# Patient Record
Sex: Male | Born: 1975 | Hispanic: Yes | Marital: Married | Smoking: Never smoker
Health system: Southern US, Community
[De-identification: ages and names within clinical notes are randomized; demographics above are authoritative.]

## PROBLEM LIST (undated history)

## (undated) DIAGNOSIS — R7303 Prediabetes: Secondary | ICD-10-CM

---

## 2012-04-24 HISTORY — PX: BACK SURGERY: SHX140

## 2017-12-30 ENCOUNTER — Emergency Department
Admission: EM | Admit: 2017-12-30 | Discharge: 2017-12-30 | Disposition: A | Discharge status: Home / Self Care | Payer: BLUE CROSS/BLUE SHIELD | Attending: Emergency Medicine | Admitting: Emergency Medicine

## 2017-12-30 ENCOUNTER — Encounter: Payer: Self-pay | Admitting: Emergency Medicine

## 2017-12-30 ENCOUNTER — Emergency Department: Payer: BLUE CROSS/BLUE SHIELD

## 2017-12-30 DIAGNOSIS — W1789XA Other fall from one level to another, initial encounter: Secondary | ICD-10-CM | POA: Insufficient documentation

## 2017-12-30 DIAGNOSIS — Y9389 Activity, other specified: Secondary | ICD-10-CM | POA: Insufficient documentation

## 2017-12-30 DIAGNOSIS — S20211A Contusion of right front wall of thorax, initial encounter: Secondary | ICD-10-CM

## 2017-12-30 DIAGNOSIS — Z79899 Other long term (current) drug therapy: Secondary | ICD-10-CM | POA: Insufficient documentation

## 2017-12-30 DIAGNOSIS — S20221A Contusion of right back wall of thorax, initial encounter: Secondary | ICD-10-CM

## 2017-12-30 DIAGNOSIS — Y92008 Other place in unspecified non-institutional (private) residence as the place of occurrence of the external cause: Secondary | ICD-10-CM | POA: Insufficient documentation

## 2017-12-30 DIAGNOSIS — Y998 Other external cause status: Secondary | ICD-10-CM | POA: Diagnosis not present

## 2017-12-30 DIAGNOSIS — S300XXA Contusion of lower back and pelvis, initial encounter: Secondary | ICD-10-CM | POA: Diagnosis not present

## 2017-12-30 DIAGNOSIS — S3992XA Unspecified injury of lower back, initial encounter: Secondary | ICD-10-CM | POA: Diagnosis present

## 2017-12-30 MED ORDER — KETOROLAC TROMETHAMINE 30 MG/ML IJ SOLN
30.0000 mg | Freq: Once | INTRAMUSCULAR | Status: AC
  Administered 2017-12-30: 30 mg via INTRAMUSCULAR
  Filled 2017-12-30: qty 1

## 2017-12-30 MED ORDER — BACLOFEN 10 MG PO TABS: 10.0000 mg | ORAL_TABLET | Freq: Three times a day (TID) | ORAL | 1 refills | Status: AC

## 2017-12-30 MED ORDER — CYCLOBENZAPRINE HCL 10 MG PO TABS
10.0000 mg | ORAL_TABLET | Freq: Once | ORAL | Status: AC
  Administered 2017-12-30: 10 mg via ORAL
  Filled 2017-12-30: qty 1

## 2017-12-30 MED ORDER — MELOXICAM 15 MG PO TABS: 15.0000 mg | ORAL_TABLET | Freq: Every day | ORAL | 2 refills | Status: AC

## 2017-12-30 MED ORDER — TRAMADOL HCL 50 MG PO TABS: 50.0000 mg | ORAL_TABLET | Freq: Four times a day (QID) | ORAL | 0 refills | Status: DC | PRN

## 2017-12-30 NOTE — Discharge Instructions (Addendum)
Follow-up with your regular doctor if not better in 5 - 7 days you can see Dr. Samuel Germany office.  Please call for an appointment.  Apply ice to all areas that hurt.  Return emergency department if you are worsening.  Take medications as prescribed.

## 2017-12-30 NOTE — ED Notes (Signed)
See triage note  Presents s/p fall  States he fell from porch yesterday  Having pain to right lower back  Ambulates slowly d/t pain

## 2017-12-30 NOTE — ED Provider Notes (Signed)
[facility] Emergency Department Provider Note  ____________________________________________   First MD Initiated Contact with Patient 12/30/17 1248     (approximate)  I have reviewed the triage vital signs and the nursing notes.   HISTORY  Chief Complaint Fall    HPI Isaiah Taylor is a 42 y.o. male presents emergency department complaining of right sided lower back pain and right rib pain.  States he had been drinking too much last night and fell off his porch.  He states he does not remember if he lost consciousness or not because he was too drunk.  He denies any headache today.  He is stating he does not have any numbness or tingling.  He says having difficulty with movement.    History reviewed. No pertinent past medical history.  There are no active problems to display for this patient.   History reviewed. No pertinent surgical history.  Prior to Admission medications   Medication Sig Start Date End Date Taking? Authorizing Provider  baclofen (LIORESAL) 10 MG tablet Take 1 tablet (10 mg total) by mouth 3 (three) times daily. 12/30/17 12/30/18  Merelin Human, Roselyn Bering, PA-C  meloxicam (MOBIC) 15 MG tablet Take 1 tablet (15 mg total) by mouth daily. 12/30/17 12/30/18  Traveion Ruddock, Roselyn Bering, PA-C  traMADol (ULTRAM) 50 MG tablet Take 1 tablet (50 mg total) by mouth every 6 (six) hours as needed. 12/30/17   Faythe Ghee, PA-C    Allergies Patient has no known allergies.  No family history on file.  Social History Social History   Tobacco Use  . Smoking status: Never Smoker  . Smokeless tobacco: Never Used  Substance Use Topics  . Alcohol use: Yes    Comment: weekends  . Drug use: Not on file    Review of Systems  Constitutional: No fever/chills Eyes: No visual changes. ENT: No sore throat. Respiratory: Denies cough Genitourinary: Negative for dysuria. Musculoskeletal: Positive for rib and back pain Skin: Negative for  rash.    ____________________________________________   PHYSICAL EXAM:  VITAL SIGNS: ED Triage Vitals  Enc Vitals Group     BP 12/30/17 1217 (!) 182/87     Pulse Rate 12/30/17 1217 92     Resp 12/30/17 1217 18     Temp 12/30/17 1217 98 F (36.7 C)     Temp Source 12/30/17 1217 Oral     SpO2 12/30/17 1217 97 %     Weight 12/30/17 1218 230 lb (104.3 kg)     Height 12/30/17 1218 5\' 6"  (1.676 m)     Head Circumference --      Peak Flow --      Pain Score 12/30/17 1218 10     Pain Loc --      Pain Edu? --      Excl. in GC? --     Constitutional: Alert and oriented. Well appearing and in no acute distress.  Is able to answer all questions appropriately Eyes: Conjunctivae are normal.  Head: Atraumatic. Nose: No congestion/rhinnorhea. Mouth/Throat: Mucous membranes are moist.   Neck:  supple no lymphadenopathy noted Cardiovascular: Normal rate, regular rhythm. Heart sounds are normal Respiratory: Normal respiratory effort.  No retractions, lungs c t a, right ribs are tender to palpation Abd: soft nontender bs normal all 4 quad GU: deferred Musculoskeletal: Lumbar spine is mildly tender there is some tenderness over to the right near the SI joint.  The hip is not tender.  The upper spine is not tender.  The  right rib area is tender.   Neurologic:  Normal speech and language.  Cranial nerves II through XII are grossly intact Skin:  Skin is warm, dry and intact. No rash noted.  No bruising is noted Psychiatric: Mood and affect are normal. Speech and behavior are normal.  ____________________________________________   LABS (all labs ordered are listed, but only abnormal results are displayed)  Labs Reviewed - No data to display ____________________________________________   ____________________________________________  RADIOLOGY  X-ray of the lumbar spine is negative for fracture. Chest x-ray is negative for fractures or any other acute  abnormality  ____________________________________________   PROCEDURES  Procedure(s) performed: Toradol 30 mg IM, Flexeril 10 mg p.o.  Procedures    ____________________________________________   INITIAL IMPRESSION / ASSESSMENT AND PLAN / ED COURSE  Pertinent labs & imaging results that were available during my care of the patient were reviewed by me and considered in my medical decision making (see chart for details).   Patient is a 42 year old male presents emergency department complaining of lower back pain and right rib pain.  He states he was drinking heavily last night and fell off of his porch.  He states he may have hit his head but he is not sure.  He states he did not pass out.  He has had no neurologic problems today.  He is just complaining of the back pain.  He denies abdominal pain.  On physical exam patient appears well.  The lumbar spine is tender to palpation in the right ribs are tender to palpation abdomen is soft and nontender.  Cranial nerves II through XII are grossly intact.  X-ray of the lumbar spine shows no acute abnormality.  The x-ray of the chest is negative for any acute abnormality.  Explained the findings to the patient.  He was given a injection of Toradol 30 mg IM and Flexeril 10 mg p.o. He was given a prescription for baclofen, meloxicam, and tramadol.  The patient was instructed not to drink with these medications.  He is to apply ice to all areas that hurt.  Explained to him if he is worsening or develops abdominal pain he needs to return to the emergency department.  He states he understands and will comply.  He was discharged in stable condition in the care of his family.    As part of my medical decision making, I reviewed the following data within the electronic MEDICAL RECORD NUMBER Nursing notes reviewed and incorporated, Old chart reviewed, Radiograph reviewed x-ray lumbar spine and chest are negative, Notes from prior ED visits and [place] Controlled  Substance Database  ____________________________________________   FINAL CLINICAL IMPRESSION(S) / ED DIAGNOSES  Final diagnoses:  Back contusion, right, initial encounter  Rib contusion, right, initial encounter      NEW MEDICATIONS STARTED DURING THIS VISIT:  Discharge Medication List as of 12/30/2017  2:20 PM    START taking these medications   Details  baclofen (LIORESAL) 10 MG tablet Take 1 tablet (10 mg total) by mouth 3 (three) times daily., Starting Sun 12/30/2017, Until Mon 12/30/2018, Normal    meloxicam (MOBIC) 15 MG tablet Take 1 tablet (15 mg total) by mouth daily., Starting Sun 12/30/2017, Until Mon 12/30/2018, Normal    traMADol (ULTRAM) 50 MG tablet Take 1 tablet (50 mg total) by mouth every 6 (six) hours as needed., Starting Sun 12/30/2017, Normal         Note:  This document was prepared using Dragon voice recognition software and may include unintentional  dictation errors.    Faythe Ghee, PA-C 12/30/17 1513    Dionne Bucy, MD 12/30/17 [phone]

## 2017-12-30 NOTE — ED Triage Notes (Signed)
Patient presents to the ED right side pain post fall yesterday evening.  Patient states he had been drinking too much prior to fall.  Patient states he hit his head but did not pass out.  Patient ambulatory to triage.  No obvious distress at this time.

## 2019-10-06 ENCOUNTER — Other Ambulatory Visit: Payer: Self-pay | Admitting: Radiology

## 2019-10-06 ENCOUNTER — Ambulatory Visit
Admission: RE | Admit: 2019-10-06 | Discharge: 2019-10-06 | Disposition: A | Discharge status: Home / Self Care | Payer: PRIVATE HEALTH INSURANCE | Source: Ambulatory Visit | Attending: Student | Admitting: Student

## 2019-10-06 ENCOUNTER — Other Ambulatory Visit: Payer: Self-pay

## 2019-10-06 ENCOUNTER — Other Ambulatory Visit (HOSPITAL_COMMUNITY): Payer: Self-pay | Admitting: Radiology

## 2019-10-06 ENCOUNTER — Other Ambulatory Visit (HOSPITAL_COMMUNITY): Payer: Self-pay | Admitting: Student

## 2019-10-06 DIAGNOSIS — M7989 Other specified soft tissue disorders: Secondary | ICD-10-CM

## 2020-07-22 ENCOUNTER — Other Ambulatory Visit (HOSPITAL_COMMUNITY): Payer: Self-pay | Admitting: Sports Medicine

## 2020-07-22 ENCOUNTER — Other Ambulatory Visit: Payer: Self-pay | Admitting: Sports Medicine

## 2020-07-22 DIAGNOSIS — M19011 Primary osteoarthritis, right shoulder: Secondary | ICD-10-CM

## 2020-07-22 DIAGNOSIS — M7541 Impingement syndrome of right shoulder: Secondary | ICD-10-CM

## 2020-07-22 DIAGNOSIS — M778 Other enthesopathies, not elsewhere classified: Secondary | ICD-10-CM

## 2020-07-22 DIAGNOSIS — M25511 Pain in right shoulder: Secondary | ICD-10-CM

## 2020-07-26 ENCOUNTER — Ambulatory Visit
Admission: RE | Admit: 2020-07-26 | Discharge: 2020-07-26 | Disposition: A | Discharge status: Home / Self Care | Payer: BC Managed Care – PPO | Source: Ambulatory Visit | Attending: Sports Medicine | Admitting: Sports Medicine

## 2020-07-26 ENCOUNTER — Other Ambulatory Visit: Payer: Self-pay

## 2020-07-26 DIAGNOSIS — M19011 Primary osteoarthritis, right shoulder: Secondary | ICD-10-CM | POA: Diagnosis present

## 2020-07-26 DIAGNOSIS — M25511 Pain in right shoulder: Secondary | ICD-10-CM | POA: Diagnosis present

## 2020-07-26 DIAGNOSIS — M778 Other enthesopathies, not elsewhere classified: Secondary | ICD-10-CM | POA: Insufficient documentation

## 2020-07-26 DIAGNOSIS — M7541 Impingement syndrome of right shoulder: Secondary | ICD-10-CM | POA: Insufficient documentation

## 2020-08-09 ENCOUNTER — Other Ambulatory Visit: Payer: Self-pay | Admitting: Orthopedic Surgery

## 2020-08-16 ENCOUNTER — Other Ambulatory Visit: Payer: Self-pay

## 2020-08-16 ENCOUNTER — Other Ambulatory Visit
Admission: RE | Admit: 2020-08-16 | Discharge: 2020-08-16 | Disposition: A | Discharge status: Home / Self Care | Payer: BC Managed Care – PPO | Source: Ambulatory Visit | Attending: Orthopedic Surgery | Admitting: Orthopedic Surgery

## 2020-08-16 HISTORY — DX: Prediabetes: R73.03

## 2020-08-16 NOTE — Patient Instructions (Addendum)
Your procedure is scheduled on: Monday Aug 23, 2020. Su procedimiento est programado para: Lunes 2 de Mayo del 2022. Report to Day Surgery inside [facility] 2nd floor (stop by admissions desk first before getting on elevator). Presntese a: [facility] dentro del [facility] 2do piso (registrese primero en admisiones antes de subir al elevador) To find out your arrival time please call [phone] between 1PM - 3PM on Friday August 20, 2020. Para saber su hora de llegada por favor llame al [phone] Eusebio Me la 1PM - 3PM el da: Viernes 29 de Abril del 2022.   Remember: Instructions that are not followed completely may result in serious medical risk, up to and including death,  or upon the discretion of your surgeon and anesthesiologist your surgery may need to be rescheduled.  Recuerde: Las instrucciones que no se siguen completamente Armed forces logistics/support/administrative officer en un riesgo de salud grave, incluyendo hasta  la [place] o a discrecin de su cirujano y Scientific laboratory technician, su ciruga se puede posponer.   __X_ 1.Do not eat food after midnight the night before your procedure. No    gum chewing or hard candies. You may drink clear liquids up to 2 hours     before you are scheduled to arrive for your surgery- DO not drink clear     Liquids within 2 hours of the start of your surgery.     Clear Liquids include:    water, apple juice without pulp, clear carbohydrate drink such as    Clearfast of [place], Black Coffee or Tea (Do not add anything to coffee or tea).      No coma nada despus de la medianoche de la noche anterior a su    procedimiento. No coma chicles ni caramelos duros. Puede tomar    lquidos claros hasta 2 horas antes de su hora programada de llegada al     hospital para su procedimiento. No tome lquidos claros durante el     transcurso de las 2 horas de su llegada programada al hospital para su     procedimiento, ya que esto puede llevar a que su procedimiento se    retrase o tenga  que volver a Magazine features editor.  Los lquidos claros incluyen:          - Agua o jugo de [place] sin pulpa          - Bebidas claras con carbohidratos como ClearFast o Gatorade          - Caf negro o t claro (sin leche, sin cremas, no agregue nada al caf ni al t)  No tome nada que no est en esta lista.  Los pacientes con diabetes tipo 1 y tipo 2 solo deben Printmaker.  Llame a la clnica de [facility] o a la unidad de Same Day Surgery si  tiene alguna pregunta sobre estas instrucciones.              _X__ 2.Do Not Smoke or use e-cigarettes For 24 Hours Prior to Your Surgery.    Do not use any chewable tobacco products for at least 6   hours prior to surgery.    No fume ni use cigarrillos electrnicos durante las 24 horas previas    a su [place].  No use ningn producto de tabaco masticable durante   al menos 6 horas antes de la [place].     __X_ 3. No alcohol for 24 hours before or after surgery.    No tome alcohol durante las 24  horas antes ni despus de la [place].   __X__4. On the morning of surgery brush your teeth with toothpaste and water, you                may rinse your mouth with mouthwash if you wish.  Do not swallow any toothpaste of mouthwash.   En la maana de la [place], cepllese los dientes con pasta de dientes y Summer Set,                [place] enjuagarse la boca con enjuague bucal si lo desea. No ingiera ninguna pasta de dientes o enjuague bucal.   __X__ 5. Notify your doctor if there is any change in your medical condition (cold,fever, infections).    Informe a su mdico si hay algn cambio en su condicin mdica  (resfriado, fiebre, infecciones).   Do not wear jewelry, make-up, hairpins, clips or nail polish.  No use joyas, maquillajes, pinzas/ganchos para el cabello ni esmalte de uas.  Do not wear lotions, powders, or perfumes. You may wear deodorant.  No use lociones, polvos o perfumes.    Do not shave 48 hours prior to surgery. Men may shave face and neck.  No se  afeite 48 horas antes de la [place].  Los hombres pueden [employer] cara  y el cuello.   Do not bring valuables to the hospital.   No lleve objetos de valor al hospital.  [facility] is not responsible for any belongings or valuables.  [place] no se hace responsable de ningn tipo de pertenencias u objetos de Licensed conveyancer.               Contacts, dentures or bridgework may not be worn into surgery.  Los lentes de [place], las dentaduras postizas o puentes no se pueden usar en la [place].   Leave your suitcase in the car. After surgery it may be brought to your room.  Deje su maleta en el auto.  Despus de la ciruga podr traerla a su habitacin.   For patients admitted to the hospital, discharge time is determined by your  treatment team.  Para los pacientes que sean ingresados al hospital, el tiempo en el cual se le  dar de alta es determinado por su equipo de [place].   Patients discharged the day of surgery will not be allowed to drive home. A los pacientes que se les da de alta el mismo da de la ciruga no se les permitir conducir a Higher education careers adviser.   __X__ Take these medicines the morning of surgery with A SIP OF WATER:          [place] medicinas la maana de la ciruga con UN SORBO DE AGUA:    1. traMADol (ULTRAM) 50 MG (if needed)  2. oxyCODONE-acetaminophen (PERCOCET/ROXICET) 5-325 MG (if needed)  3.       ____ Fleet Enema (as directed)          Enema de Fleet (segn lo indicado)    __X__ Use CHG Soap as directed          Utilice el jabn de CHG segn lo indicado  ____ Use inhalers on the day of surgery          Use los inhaladores el da de la ciruga  ____ Stop metformin 2 days prior to surgery          Deje de tomar el metformin 2 das antes de la cir[place]    ____ Take 1/2 of usual insulin  dose the night before surgery and none on the morning of surgery           Tome la mitad de la dosis habitual de insulina la noche antes de la [place] y no tome nada en la maana  de la             ciruga  ____ Stop Coumadin/Plavix/aspirin           Deje de tomar el Coumadin/Plavix/aspirina el da:  __X__ Stop Anti-inflammatories such as meloxicam (MOBIC), Ibuprofen, Aleve, Advil, naproxen, aspirin, Goody's or BC powders.           Deje de tomar antiinflamatorios como meloxicam (MOBIC), Ibuprofen, Aleve, Advil, naproxen, aspirinas, Goody's or BC powders.    __X__ Stop supplements until after surgery            Deje de tomar suplementos hasta despus de la ciruga  ____ Bring C-Pap to the hospital          Lleve el C-Pap al hospital

## 2020-08-19 ENCOUNTER — Other Ambulatory Visit: Payer: Self-pay

## 2020-08-19 ENCOUNTER — Other Ambulatory Visit
Admission: RE | Admit: 2020-08-19 | Discharge: 2020-08-19 | Disposition: A | Discharge status: Home / Self Care | Payer: BC Managed Care – PPO | Source: Ambulatory Visit | Attending: Orthopedic Surgery | Admitting: Orthopedic Surgery

## 2020-08-19 DIAGNOSIS — Z01812 Encounter for preprocedural laboratory examination: Secondary | ICD-10-CM | POA: Diagnosis not present

## 2020-08-19 DIAGNOSIS — Z20822 Contact with and (suspected) exposure to covid-19: Secondary | ICD-10-CM | POA: Insufficient documentation

## 2020-08-19 LAB — SARS CORONAVIRUS 2 (TAT 6-24 HRS): SARS Coronavirus 2: NEGATIVE

## 2020-08-23 ENCOUNTER — Ambulatory Visit: Payer: BC Managed Care – PPO | Admitting: Anesthesiology

## 2020-08-23 ENCOUNTER — Encounter
Admission: RE | Disposition: A | Discharge status: Home / Self Care | Payer: Self-pay | Source: Ambulatory Visit | Attending: Orthopedic Surgery

## 2020-08-23 ENCOUNTER — Ambulatory Visit
Admission: RE | Admit: 2020-08-23 | Discharge: 2020-08-23 | Disposition: A | Discharge status: Home / Self Care | Payer: BC Managed Care – PPO | Source: Ambulatory Visit | Attending: Orthopedic Surgery | Admitting: Orthopedic Surgery

## 2020-08-23 ENCOUNTER — Ambulatory Visit: Payer: BC Managed Care – PPO

## 2020-08-23 DIAGNOSIS — Z79899 Other long term (current) drug therapy: Secondary | ICD-10-CM | POA: Insufficient documentation

## 2020-08-23 DIAGNOSIS — E119 Type 2 diabetes mellitus without complications: Secondary | ICD-10-CM | POA: Insufficient documentation

## 2020-08-23 DIAGNOSIS — Z419 Encounter for procedure for purposes other than remedying health state, unspecified: Secondary | ICD-10-CM

## 2020-08-23 DIAGNOSIS — Z833 Family history of diabetes mellitus: Secondary | ICD-10-CM | POA: Insufficient documentation

## 2020-08-23 DIAGNOSIS — M19011 Primary osteoarthritis, right shoulder: Secondary | ICD-10-CM | POA: Insufficient documentation

## 2020-08-23 DIAGNOSIS — M75101 Unspecified rotator cuff tear or rupture of right shoulder, not specified as traumatic: Secondary | ICD-10-CM | POA: Diagnosis present

## 2020-08-23 SURGERY — SHOULDER ARTHROSCOPY WITH SUBACROMIAL DECOMPRESSION AND DISTAL CLAVICLE EXCISION
Anesthesia: General | Laterality: Right

## 2020-08-23 MED ORDER — ONDANSETRON HCL 4 MG/2ML IJ SOLN
INTRAMUSCULAR | Status: AC
  Administered 2020-08-23: 4 mg via INTRAVENOUS
  Filled 2020-08-23: qty 2

## 2020-08-23 MED ORDER — FENTANYL CITRATE (PF) 100 MCG/2ML IJ SOLN
INTRAMUSCULAR | Status: DC | PRN
  Administered 2020-08-23: 25 ug via INTRAVENOUS

## 2020-08-23 MED ORDER — BUPIVACAINE HCL (PF) 0.5 % IJ SOLN
INTRAMUSCULAR | Status: AC
  Filled 2020-08-23: qty 10

## 2020-08-23 MED ORDER — ACETAMINOPHEN 10 MG/ML IV SOLN
INTRAVENOUS | Status: AC
  Filled 2020-08-23: qty 100

## 2020-08-23 MED ORDER — ORAL CARE MOUTH RINSE: 15.0000 mL | Freq: Once | OROMUCOSAL | Status: AC

## 2020-08-23 MED ORDER — HYDROMORPHONE HCL 1 MG/ML IJ SOLN
INTRAMUSCULAR | Status: AC
  Filled 2020-08-23: qty 1

## 2020-08-23 MED ORDER — LIDOCAINE HCL (CARDIAC) PF 100 MG/5ML IV SOSY
PREFILLED_SYRINGE | INTRAVENOUS | Status: DC | PRN
  Administered 2020-08-23: 60 mg via INTRAVENOUS

## 2020-08-23 MED ORDER — CEFAZOLIN SODIUM-DEXTROSE 2-4 GM/100ML-% IV SOLN
INTRAVENOUS | Status: AC
  Filled 2020-08-23: qty 100

## 2020-08-23 MED ORDER — DEXAMETHASONE SODIUM PHOSPHATE 10 MG/ML IJ SOLN
INTRAMUSCULAR | Status: DC | PRN
  Administered 2020-08-23: 10 mg via INTRAVENOUS

## 2020-08-23 MED ORDER — FAMOTIDINE 20 MG PO TABS
ORAL_TABLET | ORAL | Status: AC
  Administered 2020-08-23: 20 mg via ORAL
  Filled 2020-08-23: qty 1

## 2020-08-23 MED ORDER — MIDAZOLAM HCL 2 MG/2ML IJ SOLN
INTRAMUSCULAR | Status: AC
  Administered 2020-08-23: 1 mg via INTRAVENOUS
  Filled 2020-08-23: qty 2

## 2020-08-23 MED ORDER — BUPIVACAINE LIPOSOME 1.3 % IJ SUSP
INTRAMUSCULAR | Status: AC
  Filled 2020-08-23: qty 20

## 2020-08-23 MED ORDER — ONDANSETRON 4 MG PO TBDP: 4.0000 mg | ORAL_TABLET | Freq: Three times a day (TID) | ORAL | 0 refills | Status: DC | PRN

## 2020-08-23 MED ORDER — MIDAZOLAM HCL 2 MG/2ML IJ SOLN: 1.0000 mg | Freq: Once | INTRAMUSCULAR | Status: AC

## 2020-08-23 MED ORDER — FENTANYL CITRATE (PF) 100 MCG/2ML IJ SOLN: 25.0000 ug | INTRAMUSCULAR | Status: DC | PRN

## 2020-08-23 MED ORDER — ONDANSETRON HCL 4 MG/2ML IJ SOLN
INTRAMUSCULAR | Status: DC | PRN
  Administered 2020-08-23: 4 mg via INTRAVENOUS

## 2020-08-23 MED ORDER — SUCCINYLCHOLINE CHLORIDE 20 MG/ML IJ SOLN
INTRAMUSCULAR | Status: DC | PRN
Start: 2020-08-23 — End: 2020-08-24
  Administered 2020-08-23: 100 mg via INTRAVENOUS

## 2020-08-23 MED ORDER — 0.9 % SODIUM CHLORIDE (POUR BTL) OPTIME
TOPICAL | Status: DC | PRN
  Administered 2020-08-23: 1000 mL

## 2020-08-23 MED ORDER — PROPOFOL 10 MG/ML IV BOLUS
INTRAVENOUS | Status: AC
  Filled 2020-08-23: qty 20

## 2020-08-23 MED ORDER — FENTANYL CITRATE (PF) 100 MCG/2ML IJ SOLN: 50.0000 ug | Freq: Once | INTRAMUSCULAR | Status: AC

## 2020-08-23 MED ORDER — LIDOCAINE HCL (PF) 2 % IJ SOLN
INTRAMUSCULAR | Status: AC
  Filled 2020-08-23: qty 5

## 2020-08-23 MED ORDER — LACTATED RINGERS IV SOLN: INTRAVENOUS | Status: DC

## 2020-08-23 MED ORDER — ONDANSETRON HCL 4 MG/2ML IJ SOLN: 4.0000 mg | Freq: Once | INTRAMUSCULAR | Status: AC | PRN

## 2020-08-23 MED ORDER — OXYCODONE HCL 5 MG PO TABS: 5.0000 mg | ORAL_TABLET | Freq: Once | ORAL | Status: DC | PRN

## 2020-08-23 MED ORDER — FENTANYL CITRATE (PF) 100 MCG/2ML IJ SOLN
INTRAMUSCULAR | Status: AC
  Filled 2020-08-23: qty 2

## 2020-08-23 MED ORDER — OXYCODONE HCL 5 MG/5ML PO SOLN
5.0000 mg | Freq: Once | ORAL | Status: DC | PRN
Start: 2020-08-23 — End: 2020-08-23

## 2020-08-23 MED ORDER — BUPIVACAINE LIPOSOME 1.3 % IJ SUSP
INTRAMUSCULAR | Status: DC | PRN
  Administered 2020-08-23: 10 mL

## 2020-08-23 MED ORDER — MIDAZOLAM HCL 2 MG/2ML IJ SOLN
INTRAMUSCULAR | Status: AC
  Filled 2020-08-23: qty 2

## 2020-08-23 MED ORDER — CHLORHEXIDINE GLUCONATE 0.12 % MT SOLN: 15.0000 mL | Freq: Once | OROMUCOSAL | Status: AC

## 2020-08-23 MED ORDER — FENTANYL CITRATE (PF) 100 MCG/2ML IJ SOLN
INTRAMUSCULAR | Status: AC
  Administered 2020-08-23: 50 ug via INTRAVENOUS
  Filled 2020-08-23: qty 2

## 2020-08-23 MED ORDER — ACETAMINOPHEN 500 MG PO TABS: 1000.0000 mg | ORAL_TABLET | Freq: Three times a day (TID) | ORAL | 2 refills | Status: AC

## 2020-08-23 MED ORDER — SUGAMMADEX SODIUM 200 MG/2ML IV SOLN
INTRAVENOUS | Status: DC | PRN
  Administered 2020-08-23: 200 mg via INTRAVENOUS

## 2020-08-23 MED ORDER — CHLORHEXIDINE GLUCONATE 0.12 % MT SOLN
OROMUCOSAL | Status: AC
  Administered 2020-08-23: 15 mL via OROMUCOSAL
  Filled 2020-08-23: qty 15

## 2020-08-23 MED ORDER — ROCURONIUM BROMIDE 100 MG/10ML IV SOLN
INTRAVENOUS | Status: DC | PRN
  Administered 2020-08-23 (×2): 10 mg via INTRAVENOUS
  Administered 2020-08-23: 40 mg via INTRAVENOUS
  Administered 2020-08-23 (×2): 10 mg via INTRAVENOUS
  Administered 2020-08-23: 20 mg via INTRAVENOUS

## 2020-08-23 MED ORDER — ASPIRIN EC 325 MG PO TBEC: 325.0000 mg | DELAYED_RELEASE_TABLET | Freq: Every day | ORAL | 0 refills | Status: AC

## 2020-08-23 MED ORDER — OXYCODONE HCL 5 MG PO TABS: 5.0000 mg | ORAL_TABLET | ORAL | 0 refills | Status: AC | PRN

## 2020-08-23 MED ORDER — CEFAZOLIN SODIUM-DEXTROSE 2-4 GM/100ML-% IV SOLN
2.0000 g | INTRAVENOUS | Status: AC
  Administered 2020-08-23: 2 g via INTRAVENOUS

## 2020-08-23 MED ORDER — PROPOFOL 10 MG/ML IV BOLUS
INTRAVENOUS | Status: DC | PRN
  Administered 2020-08-23: 150 mg via INTRAVENOUS
  Administered 2020-08-23: 50 mg via INTRAVENOUS

## 2020-08-23 MED ORDER — FAMOTIDINE 20 MG PO TABS: 20.0000 mg | ORAL_TABLET | Freq: Once | ORAL | Status: AC

## 2020-08-23 MED ORDER — ACETAMINOPHEN 10 MG/ML IV SOLN: 1000.0000 mg | Freq: Once | INTRAVENOUS | Status: DC | PRN

## 2020-08-23 MED ORDER — BUPIVACAINE HCL (PF) 0.5 % IJ SOLN
INTRAMUSCULAR | Status: DC | PRN
  Administered 2020-08-23: 10 mL

## 2020-08-23 MED ORDER — LACTATED RINGERS IV SOLN
INTRAVENOUS | Status: DC | PRN
  Administered 2020-08-23: 4 mL

## 2020-08-23 MED ORDER — ACETAMINOPHEN 10 MG/ML IV SOLN
INTRAVENOUS | Status: DC | PRN
  Administered 2020-08-23: 1000 mg via INTRAVENOUS

## 2020-08-23 SURGICAL SUPPLY — 83 items
ADAPTER IRRIG TUBE 2 SPIKE SOL (ADAPTER) ×4 IMPLANT
ADH SKN CLS APL DERMABOND .7 (GAUZE/BANDAGES/DRESSINGS) ×1
ADPR TBG 2 SPK PMP STRL ASCP (ADAPTER) ×2
ANCH SUT 2 SWLK 19.1 CLS EYLT (Anchor) ×2 IMPLANT
ANCH SUT SWLK 19.1X4.75 (Anchor) ×3 IMPLANT
ANCHOR 3.9 PEEK 3 CORKSCREW (Anchor) ×2 IMPLANT
ANCHOR SUT BIO SW 4.75X19.1 (Anchor) ×6 IMPLANT
ANCHOR SWIVELOCK BIO 4.75X19.1 (Anchor) ×4 IMPLANT
APL PRP STRL LF DISP 70% ISPRP (MISCELLANEOUS) ×1
BUR BR 5.5 12 FLUTE (BURR) ×2 IMPLANT
BUR RADIUS 4.0X18.5 (BURR) ×2 IMPLANT
BUR RADIUS 5.5 (BURR) ×2 IMPLANT
CANNULA PART THRD DISP 5.75X7 (CANNULA) ×4 IMPLANT
CANNULA PARTIAL THREAD 2X7 (CANNULA) ×2 IMPLANT
CHLORAPREP W/TINT 26 (MISCELLANEOUS) ×2 IMPLANT
COOLER POLAR GLACIER W/PUMP (MISCELLANEOUS) ×2 IMPLANT
COVER WAND RF STERILE (DRAPES) ×2 IMPLANT
DERMABOND ADVANCED (GAUZE/BANDAGES/DRESSINGS) ×1
DERMABOND ADVANCED .7 DNX12 (GAUZE/BANDAGES/DRESSINGS) ×1 IMPLANT
DEVICE SUCT BLK HOLE OR FLOOR (MISCELLANEOUS) ×4 IMPLANT
DRAPE 3/4 80X56 (DRAPES) ×2 IMPLANT
DRAPE IMP U-DRAPE 54X76 (DRAPES) ×4 IMPLANT
DRAPE INCISE IOBAN 66X45 STRL (DRAPES) ×2 IMPLANT
DRAPE ORTHO SPLIT 77X108 STRL (DRAPES) ×4
DRAPE STERI 35X30 U-POUCH (DRAPES) ×2 IMPLANT
DRAPE SURG ORHT 6 SPLT 77X108 (DRAPES) ×2 IMPLANT
DRAPE U-SHAPE 47X51 STRL (DRAPES) ×4 IMPLANT
DRSG TEGADERM 4X4.75 (GAUZE/BANDAGES/DRESSINGS) ×6 IMPLANT
ELECT REM PT RETURN 9FT ADLT (ELECTROSURGICAL)
ELECTRODE REM PT RTRN 9FT ADLT (ELECTROSURGICAL) IMPLANT
GAUZE SPONGE 4X4 12PLY STRL (GAUZE/BANDAGES/DRESSINGS) ×2 IMPLANT
GAUZE XEROFORM 1X8 LF (GAUZE/BANDAGES/DRESSINGS) ×2 IMPLANT
GLOVE SRG 8 PF TXTR STRL LF DI (GLOVE) ×2 IMPLANT
GLOVE SURG ENC MOIS LTX SZ7.5 (GLOVE) ×2 IMPLANT
GLOVE SURG ORTHO LTX SZ8 (GLOVE) ×2 IMPLANT
GLOVE SURG SYN 7.5  E (GLOVE) ×1
GLOVE SURG SYN 7.5 E (GLOVE) ×1 IMPLANT
GLOVE SURG UNDER POLY LF SZ8 (GLOVE) ×4
GOWN STRL REUS W/ TWL LRG LVL3 (GOWN DISPOSABLE) ×2 IMPLANT
GOWN STRL REUS W/TWL LRG LVL3 (GOWN DISPOSABLE) ×4
GOWN STRL REUS W/TWL LRG LVL4 (GOWN DISPOSABLE) ×2 IMPLANT
IV LACTATED RINGER IRRG 3000ML (IV SOLUTION) ×16
IV LR IRRIG 3000ML ARTHROMATIC (IV SOLUTION) ×8 IMPLANT
KIT CORKSCREW KNTLS 3.9 S/T/P (INSTRUMENTS) ×2 IMPLANT
KIT STABILIZATION SHOULDER (MISCELLANEOUS) ×2 IMPLANT
KIT SUTURETAK 3.0 INSERT PERC (KITS) ×2 IMPLANT
KIT TURNOVER KIT A (KITS) ×2 IMPLANT
MANIFOLD NEPTUNE II (INSTRUMENTS) ×4 IMPLANT
MASK FACE SPIDER DISP (MASK) ×2 IMPLANT
MAT ABSORB  FLUID 56X50 GRAY (MISCELLANEOUS) ×2
MAT ABSORB FLUID 56X50 GRAY (MISCELLANEOUS) ×2 IMPLANT
NDL SAFETY ECLIPSE 18X1.5 (NEEDLE) ×1 IMPLANT
NEEDLE HYPO 18GX1.5 SHARP (NEEDLE) ×2
NEEDLE HYPO 22GX1.5 SAFETY (NEEDLE) ×2 IMPLANT
NS IRRIG 500ML POUR BTL (IV SOLUTION) ×2 IMPLANT
PACK ARTHROSCOPY SHOULDER (MISCELLANEOUS) ×2 IMPLANT
PAD ABD DERMACEA PRESS 5X9 (GAUZE/BANDAGES/DRESSINGS) ×2 IMPLANT
PAD ARMBOARD 7.5X6 YLW CONV (MISCELLANEOUS) ×2 IMPLANT
PAD WRAPON POLAR SHDR XLG (MISCELLANEOUS) ×1 IMPLANT
PASSER SUT FIRSTPASS SELF (INSTRUMENTS) ×4 IMPLANT
PENCIL SMOKE EVACUATOR (MISCELLANEOUS) ×2 IMPLANT
SET TUBE SUCT SHAVER OUTFL 24K (TUBING) ×2 IMPLANT
SET TUBE TIP INTRA-ARTICULAR (MISCELLANEOUS) ×2 IMPLANT
SLING ULTRA II M (MISCELLANEOUS) ×2 IMPLANT
STAPLER SKIN PROX 35W (STAPLE) ×2 IMPLANT
STRAP SAFETY 5IN WIDE (MISCELLANEOUS) ×2 IMPLANT
SUT ETHILON 3-0 FS-10 30 BLK (SUTURE) ×2
SUT LASSO 90 DEG CVD (SUTURE) ×2 IMPLANT
SUT LASSO 90 DEG SD STR (SUTURE) ×2 IMPLANT
SUT MNCRL 4-0 (SUTURE) ×2
SUT MNCRL 4-0 27XMFL (SUTURE) ×1
SUT PDS AB 0 CT1 27 (SUTURE) ×2 IMPLANT
SUT VIC AB 0 CT1 36 (SUTURE) ×2 IMPLANT
SUT VIC AB 2-0 CT2 27 (SUTURE) ×2 IMPLANT
SUTURE EHLN 3-0 FS-10 30 BLK (SUTURE) ×1 IMPLANT
SUTURE MNCRL 4-0 27XMF (SUTURE) ×1 IMPLANT
SYR 10ML LL (SYRINGE) ×2 IMPLANT
SYSTEM FBRTK BICEPS 1.9 DRILL (Anchor) ×2 IMPLANT
TAPE CLOTH 3X10 WHT NS LF (GAUZE/BANDAGES/DRESSINGS) ×2 IMPLANT
TUBING ARTHRO INFLOW-ONLY STRL (TUBING) ×2 IMPLANT
TUBING CONNECTING 10 (TUBING) ×2 IMPLANT
WRAP SHOULDER HOT/COLD PACK (SOFTGOODS) ×2 IMPLANT
WRAPON POLAR PAD SHDR XLG (MISCELLANEOUS) ×2

## 2020-08-23 NOTE — H&P (Signed)
Paper H&P to be scanned into permanent record. H&P reviewed. No significant changes noted.  

## 2020-08-23 NOTE — Op Note (Signed)
SURGERY DATE:  08/23/2020   PRE-OP DIAGNOSIS:  1. Right rotator cuff tear (subscapularis, supraspinatus, infraspinatus) 2. Right subacromial impingement 3. Right biceps tendinopathy 4. Right acromioclavicular joint arthritis   POST-OP DIAGNOSIS: 1. Right rotator cuff tear (subscapularis, supraspinatus, infraspinatus) 2. Right subacromial impingement 3. Right biceps tendinopathy 4. Right acromioclavicular joint arthritis  PROCEDURES:  1. Right arthroscopic rotator cuff repair (subscapularis) 2. Right mini-open rotator cuff repair (supraspinatus and infraspinatus) 3. Right open biceps tenodesis 4. Right arthroscopic extensive debridement of shoulder (glenohumeral and subacromial spaces) 5. Right arthroscopic distal clavicle excision 6. Right arthroscopic subacromial decompression   SURGEON: Rosealee Albee, MD   ASSISTANT: Sonny Dandy, PA   ANESTHESIA: Gen with Exparel interscalene block   ESTIMATED BLOOD LOSS: 25cc   DRAINS:  none   TOTAL IV FLUIDS: per anesthesia      SPECIMENS: none   IMPLANTS: - Arthrex 4.53mm SwiveLock x 5 - Arthrex FiberTak Suture Anchor - Double Loaded - x1 - Arthrex 3.73mm Knotless Corkscrew - x1   OPERATIVE FINDINGS:  Examination under anesthesia: A careful examination under anesthesia was performed.  Passive range of motion was: FF: 150; ER at side: 70; ER in abduction: 100; IR in abduction: 45.  Anterior load shift: NT.  Posterior load shift: NT.  Sulcus in neutral: NT.  Sulcus in ER: NT.     Intra-operative findings: A thorough arthroscopic examination of the shoulder was performed.  The findings are: 1. Biceps tendon: significant tendinopathy, thickening, and longitudinal split tearing 2. Superior labrum: injected with surrounding synovitis 3. Posterior labrum and capsule: normal 4. Inferior capsule and inferior recess: normal 5. Glenoid cartilage surface: Normal 6. Supraspinatus attachment: Full-thickness tear of the musculotendinous  junction 7. Posterior rotator cuff attachment: Full-thickness tear of the anterior infraspinatus at the musculotendinous junction 8. Humeral head articular cartilage: normal 9. Rotator interval: significant synovitis 10: Subscapularis tendon: superior border partial thickness tear 11. Anterior labrum: degenerative 12. IGHL: normal   OPERATIVE REPORT:    Indications for procedure:  Isaiah Taylor is a 45 y.o. male with approximately 5-6 months of shoulder pain. He has had difficulty lifting his arm over his head and difficulty with work, which involves significant overhead and heavy activity. He has undergone a trial of medications, activity modifications, and corticosteroid injections without appropriate relief of symptoms. Of note, he did get significant partial benefit from prior [facility] joint corticosteroid injection. He is unable to work at his normal capacity due to his shoulder pain and weakness. Clinical exam and MRI were suggestive of rotator cuff tear including subscapularis, supraspinatus, and infraspinatus tears; subacromial impingement; AC joint arthritis and biceps tendinopathy. Given these findings, we decided to proceed with surgical management. After discussion of risks, benefits, and alternatives to surgery, the patient elected to proceed.    Procedure in detail:   I identified Isaiah Taylor in the pre-operative holding area.  I marked the operative shoulder with my initials. I reviewed the risks and benefits of the proposed surgical intervention, and the patient (and/or patient's guardian) wished to proceed.  Anesthesia was then performed with an Exparel interscalene block.  The patient was transferred to the operative suite and placed in the beach chair position.     SCDs were placed on the lower extremities. Appropriate IV antibiotics were administered prior to incision. The operative upper extremity was then prepped and draped in standard fashion. A time out was performed  confirming the correct extremity, correct patient, and correct procedure.    I then created a  standard posterior portal with an 11 blade. The glenohumeral joint was easily entered with a blunt trocar and the arthroscope introduced. The findings of diagnostic arthroscopy are described above.  A standard anterior portal was made.  I debrided degenerative tissue including the synovitic tissue about the rotator interval as well as the anterior and superior labrum. I then coagulated the inflamed synovium to obtain hemostasis and reduce the risk of post-operative swelling using an Arthrocare radiofrequency device. The biceps tendon was cut at its insertion on the superior labrum with arthroscopic scissors. The biceps anchor complex was debrided down to its insertion on the superior labrum with an oscillating shaver. An ArthroCare wand was used to debride the proximal biceps tendon stump   Next, arthroscopic repair of the subscapularis was performed. The lesser tuberosity footprint was prepared with a combination of electrocautery and an arthroscopic curette.  An Arthrex knotless corkscrew was placed into the lesser tuberosity footprint from the anterior portal.  A BirdBeak was used to shuttle the repair suture through the upper border of the subscapularis tendon.  The suture was then shuttled through the anchor. With the arm in neutral rotation, the repair was tensioned appropriately. This appropriately reduced the subscapularis tear.  The arm was then internally and externally rotated and the subscapularis was noted to move appropriately with rotation.  The remainder of the suture was then cut.  Next, the arthroscope was then introduced into the subacromial space.  An extensive subacromial bursectomy was performed using a combination of the shaver and Arthrocare wand. The entire acromial undersurface was exposed and the CA ligament was subperiosteally elevated to expose the anterior acromial hook. A 5.73mm barrel  burr was used to create a flat anterior and lateral aspect of the acromion, converting it from a Type 2 to a Type 1 acromion. Care was made to keep the deltoid fascia intact.   A longitudinal incision from the anterolateral acromion ~7cm in length was made overlying the raphe between the anterior and middle heads of the deltoid.  This incision also incorporated the anterolateral portal.  The raphe was identified and it was incised. The subacromial space was identified. Any remaining bursa was excised. The rotator cuff tear involving the supraspinatus and the anterior infraspinatus was identified. It was a tear at the musculotendinous junction.  The rotator cuff tissue on the anterior aspect of the footprint laterally was degenerative and of poor quality.  The posterior supraspinatus/infraspinatus lateral tendon stump was of good quality.   We then turned our attention to the biceps tenodesis. The arm was externally rotated.  The bicipital groove was identified.  A 15 blade was used to make a cut overlying the biceps tendon, and the tendon was removed using a right angle clamp.  The base of the bicipital groove was identified and cleared of soft tissue.  A FiberTak anchor was placed in the bicipital groove.  The biceps tendon was held at the appropriate amount of tension.  One set of sutures was passed through the biceps anchor with one limb passed in a simple fashion and the second limb passed in a simple plus locking stitch pattern.  This was repeated for the other set of sutures.  This construct allowed for shuttling the biceps tendon down to the bone.  The sutures were tied and cut.  The diseased portion of the proximal biceps was then excised.   The arm was then internally rotated.  The anterior rotator cuff footprint was cleared of soft tissue given the poor  quality tendon there. The footprint was smoothed and a rongeur was used to create a smooth, bleeding bed.  The medial rotator cuff was appropriately  mobilized.  Using a rotator cuff grasper, I could reduce the medial rotator cuff to cover the entirety of the anterior supraspinatus footprint and allow for a side-to-side repair more posteriorly where there was good quality remnant tendon.  Three 4.75 mm SwiveLock anchors double loaded with suture tape were placed just medial to the articular margin in a newly created trough made with a rongeur. The FiberWire sutures from the posterior and middle medial row anchors were passed in a side-to-side fashion, 1 strand in the lateral tendon stump, and the other strand in the medial tendon. The anterior medial row anchor FiberWire was removed due to poor quality lateral stump anteriorly.  The other 4 strands of suture from each anchor were then passed into the tendinous portion of the medial stump.  With the arm in slight abduction, both strands of side to side stitches of FiberWire were tied, reducing the medial and lateral ends of the cuff. Two SwiveLock anchors were placed for the lateral row anchors with one limb of each of the remaining medial stump sutures passed through each anchor. The knotless mechanism from the anterior lateral row anchor was utilized to reduce a small dogear. This allowed for excellent reapproximation and compression of the rotator cuff over its footprint. The construct was stable with external and internal rotation.   The wound was thoroughly irrigated.  The deltoid split was closed with 0 Vicryl.  The subdermal layer was closed with 2-0 Vicryl.  The skin was closed with staples. The portals were closed with 3-0 Nylon. Xeroform was applied to the incisions. A sterile dressing was applied, followed by a Polar Care sleeve and a SlingShot shoulder immobilizer/sling. The patient was awakened from anesthesia without difficulty and was transferred to the PACU in stable condition.    Of note, assistance from a PA was essential to performing the surgery.  PA was present for the entire surgery.  PA  assisted with patient positioning, retraction, instrumentation, and wound closure. The surgery would have been more difficult and had longer operative time without PA assistance.    Initially, there was significantly increased complexity to the surgery due to the pattern of the supraspinatus/infraspinatus tear.  It was located at the musculotendinous junction.  This necessitated multiple side-to-side stitches as well as a 3rd medial row anchor due to size.  This tear pattern at at least 30 minutes to the surgical time compared to standard mini open superior rotator cuff repair.     COMPLICATIONS: none   DISPOSITION: plan for discharge home after recovery in PACU     POSTOPERATIVE PLAN: Remain in sling (except hygiene and elbow/wrist/hand RoM exercises as instructed by PT) x 6 weeks and NWB for this time. PT to begin 3-4 days after surgery.  Use large rotator cuff repair rehab protocol with subscapularis repair.  ASA 325mg  daily x 2 weeks for DVT ppx.

## 2020-08-23 NOTE — Anesthesia Preprocedure Evaluation (Signed)
Anesthesia Evaluation  Patient identified by MRN, date of birth, ID band Patient awake    Reviewed: Allergy & Precautions, NPO status , Patient's Chart, lab work & pertinent test results  History of Anesthesia Complications Negative for: history of anesthetic complications  Airway Mallampati: III  TM Distance: >3 FB Neck ROM: Full    Dental no notable dental hx. (+) Teeth Intact   Pulmonary neg pulmonary ROS, neg sleep apnea, neg COPD, Patient abstained from smoking.Not current smoker,    Pulmonary exam normal breath sounds clear to auscultation       Cardiovascular Exercise Tolerance: Good METS(-) hypertension(-) CAD and (-) Past MI negative cardio ROS  (-) dysrhythmias  Rhythm:Regular Rate:Normal - Systolic murmurs    Neuro/Psych negative neurological ROS  negative psych ROS   GI/Hepatic neg GERD  ,(+)     (-) substance abuse  ,   Endo/Other  neg diabetes  Renal/GU negative Renal ROS     Musculoskeletal   Abdominal   Peds  Hematology   Anesthesia Other Findings Past Medical History: No date: Pre-diabetes  Reproductive/Obstetrics                             Anesthesia Physical Anesthesia Plan  ASA: II  Anesthesia Plan: General   Post-op Pain Management:  Regional for Post-op pain   Induction: Intravenous  PONV Risk Score and Plan: 2 and Ondansetron, Dexamethasone and Midazolam  Airway Management Planned: Oral ETT  Additional Equipment: None  Intra-op Plan:   Post-operative Plan: Extubation in OR  Informed Consent: I have reviewed the patients History and Physical, chart, labs and discussed the procedure including the risks, benefits and alternatives for the proposed anesthesia with the patient or authorized representative who has indicated his/her understanding and acceptance.     Dental advisory given and Interpreter used for interveiw (in-person spanish interpreter  utilized)  Plan Discussed with: CRNA and Surgeon  Anesthesia Plan Comments: (Discussed risks of anesthesia with patient, including PONV, sore throat, lip/dental damage. Rare risks discussed as well, such as cardiorespiratory and neurological sequelae.  Discussed r/b/a of interscalene block, including elective nature. Risks discussed: - Rare: bleeding, infection, nerve damage - shortness of breath from hemidiaphragmatic paralysis - unilateral horner's syndrome - poor/non-working blocks Patient understands and agrees. )        Anesthesia Quick Evaluation

## 2020-08-23 NOTE — Discharge Instructions (Signed)
DO NOT REMOVE TEAL BAND FOR 4 DAYS  No se elimine la prohibicion del verde azulado por 4 dias     Interscalene Nerve Block (ISNB) Discharge Instructions   Instrucciones al darle de alta para el bloqueo interescalnico del nervio (ISBN)  1. Usted ha recibido un bloqueo interescalnico del nervio para su [place].  2. Los bloqueos de los nervios afectan a muchos tipos de nervios, incluidos los nervios       que controlan el movimiento, el dolor y la sensacin normal. Usted puede                 experimentar sensaciones como entumecimiento, hormigueo, pesadez, debilidad o la     incapacidad de mover su brazo o la sensacin de que su brazo se ha "dormido".  3. Un bloqueo del nervio puede durar de 2 a 36 horas o ms, dependiendo del                     medicamento que se Dance movement psychotherapist. Por lo general, la debilidad desaparece primero. El              hormigueo y la pesadez usualmente desaparecen despus. Finalmente puede                comenzar a Tourist information centre manager. Tenga en cuenta que esto puede ocurrir en cualquier      orden. Una vez que el bloqueo del nervio comienza a dejar de Scientist, water quality,                    generalmente desaparece en 60 minutos.  4. El bloqueo interescalnico del nervio (ISNB) puede causar leve dificultad para                 respirar, una voz ronca, visin borrosa, pupilas desiguales o cada de la cara en el          mismo lado del bloqueo del nervio. Estos sntomas generalmente desaparecern      en 12 horas. Muy raramente el procedimiento en s puede causar convulsiones leves.  5. Si es necesario, su cirujano le dar una receta para medicamentos para Chief Technology Officer.             Tomar aproximadamente 60 minutos para que el medicamento oral para el dolor      haga su efecto completamente. Por lo tanto, se recomienda que comience a tomar      este medicamento antes de que el bloqueo del nervio comience a perder su      efecto o cuando empiece a Printmaker.  6. Tenga en cuenta que los bloqueos  de los nervios a menudo dejan de Scientist, water quality en     medio de la noche. Si se va a acostar y el bloqueo no ha comenzado a Animal nutritionist de               Scientist, water quality o si no ha empezado a sentir Wellsite geologist, considere programar una      alarma para 2 a 3 horas para que pueda evaluar cmo va su bloqueo. Si nota que el       bloqueo est comenzando a dejar de Scientist, water quality o usted est empezando a Conservation officer, historic buildings, puede tomar su medicamento para Chief Technology Officer.  7. Tome su medicamento para el dolor segn lo recetado. Los medicamentos para Engineer, civil (consulting) pueden causar  sedacin y disminuir la respiracin si toma ms de lo que                 necesita para el nivel de dolor que tiene.  8. La nusea es un efecto secundario comn de muchos medicamentos para Chief Technology Officer.        Es aconsejable comer algo antes de tomar su medicamento para el dolor para                prevenir las nuseas.  9. Despus de un bloqueo interescalnico del nervio, usted no puede Financial risk analyst,                presin o temperaturas extremas en el brazo afectado. Debido a que su brazo est         adormecido, existe un mayor riesgo de lesiones. Para disminuir la posibilidad de             lesiones, por favor practique lo siguiente:   a. Mientras est despierto(a), cambie la posicin de su brazo con frecuencia                        para evitar demasiada presin en cualquier rea por perodos de tiempo                          prolongados.   b. Si tiene un yeso o un vendaje ajustado, revise el color o los dedos de sus                 [employer]. Llame a su cirujano si aparece alguna decoloracin      (blanca o azul).   c. Si le dan un cabestrillo para usar antes de irse a casa, por favor selo en todo      momento hasta que se haya pasado el efecto del bloqueo por completo. No      se levante por la noche sin su cabestrillo.   d. Si usted tiene problemas o inquietudes, por favor comunquese con el        consultorio de su cirujano.   e.  Si experimenta dificultad respiratoria severa o prolongada, vaya al         departamento de emergencias ms cercano.    Post-Op Instructions - Rotator Cuff Repair  1. Bracing: You will wear a shoulder immobilizer or sling for 6 weeks.   2. Driving: No driving for 3 weeks post-op. When driving, do not wear the immobilizer. Ideally, we recommend no driving for 6 weeks while sling is in place as one arm will be immobilized.   3. Activity: No active lifting for 2 months. Wrist, hand, and elbow motion only. Avoid lifting the upper arm away from the body except for hygiene. You are permitted to bend and straighten the elbow passively only (no active elbow motion). You may use your hand and wrist for typing, writing, and managing utensils (cutting food). Do not lift more than a coffee cup for 8 weeks.  When sleeping or resting, inclined positions (recliner chair or wedge pillow) and a pillow under the forearm for support may provide better comfort for up to 4 weeks.  Avoid long distance travel for 4 weeks.  Return to normal activities after rotator cuff repair repair normally takes 6 months on average. If rehab goes very well, may be able to do most activities at 4 months, except overhead or contact  sports.  4. Physical Therapy: Begins 3-4 days after surgery, and proceed 1 time per week for the first 6 weeks, then 1-2 times per week from weeks 6-20 post-op.  5. Medications:  - You will be provided a prescription for narcotic pain medicine. After surgery, take 1-2 narcotic tablets every 4 hours if needed for severe pain.  - A prescription for anti-nausea medication will be provided in case the narcotic medicine causes nausea - take 1 tablet every 6 hours only if nauseated.   - Take tylenol 1000 mg (2 Extra Strength tablets or 3 regular strength) every 8 hours for pain.  May decrease or stop tylenol 5 days after surgery if you are having minimal pain. - Take ASA 325mg /day x 2 weeks to help prevent  DVTs/PEs (blood clots).  - DO NOT take ANY nonsteroidal anti-inflammatory pain medications (Advil, Motrin, Ibuprofen, Aleve, Naproxen, or Naprosyn). These medicines can inhibit healing of your shoulder repair.    If you are taking prescription medication for anxiety, depression, insomnia, muscle spasm, chronic pain, or for attention deficit disorder, you are advised that you are at a higher risk of adverse effects with use of narcotics post-op, including narcotic addiction/dependence, depressed breathing, death. If you use non-prescribed substances: alcohol, marijuana, cocaine, heroin, methamphetamines, etc., you are at a higher risk of adverse effects with use of narcotics post-op, including narcotic addiction/dependence, depressed breathing, death. You are advised that taking > 50 morphine milligram equivalents (MME) of narcotic pain medication per day results in twice the risk of overdose or death. For your prescription provided: oxycodone 5 mg - taking more than 6 tablets per day would result in > 50 morphine milligram equivalents (MME) of narcotic pain medication. Be advised that we will prescribe narcotics short-term, for acute post-operative pain only - 3 weeks for major operations such as shoulder repair/reconstruction surgeries.     6. Post-Op Appointment:  Your first post-op appointment will be 10-14 days post-op.  7. Work or School: For most, but not all procedures, we advise staying out of work or school for at least 1 to 2 weeks in order to recover from the stress of surgery and to allow time for healing.   If you need a work or school note this can be provided.   8. Smoking: If you are a smoker, you need to refrain from smoking in the postoperative period. The nicotine in cigarettes will inhibit healing of your shoulder repair and decrease the chance of successful repair. Similarly, nicotine containing products (gum, patches) should be avoided.   Post-operative Brace: Apply and  remove the brace you received as you were instructed to at the time of fitting and as described in detail as the brace's instructions for use indicate.  Wear the brace for the period of time prescribed by your physician.  The brace can be cleaned with soap and water and allowed to air dry only.  Should the brace result in increased pain, decreased feeling (numbness/tingling), increased swelling or an overall worsening of your medical condition, please contact your doctor immediately.  If an emergency situation occurs as a result of wearing the brace after normal business hours, please dial 911 and seek immediate medical attention.  Let your doctor know if you have any further questions about the brace issued to you. Refer to the shoulder sling instructions for use if you have any questions regarding the correct fit of your shoulder sling.  [facility] Customer Care for Troubleshooting: [phone]  Video that illustrates how to  properly use a shoulder sling: "Instructions for Proper Use of an Orthopaedic Sling" [email or web address]://www.[email or web address]/[email or web address]

## 2020-08-23 NOTE — Anesthesia Procedure Notes (Signed)
Anesthesia Regional Block: Interscalene brachial plexus block   Pre-Anesthetic Checklist: ,, timeout performed, Correct Patient, Correct Site, Correct Laterality, Correct Procedure, Correct Position, site marked, Risks and benefits discussed,  Surgical consent,  Pre-op evaluation,  At surgeon's request and post-op pain management  Laterality: Right  Prep: chloraprep       Needles:  Injection technique: Single-shot  Needle Type: Echogenic Needle     Needle Length: 4cm  Needle Gauge: 25     Additional Needles:   Narrative:  Start time: 08/23/2020 11:26 AM End time: 08/23/2020 11:27 AM Injection made incrementally with aspirations every 5 mL.  Performed by: Personally  Anesthesiologist: Corinda Gubler, MD  Additional Notes: Patient's chart reviewed and they were deemed appropriate candidate for procedure, at surgeon's request. Patient educated about risks, benefits, and alternatives of the block including but not limited to: temporary or permanent nerve damage, bleeding, infection, damage to surround tissues, pneumothorax, hemidiaphragmatic paralysis, unilateral Horner's syndrome, block failure, local anesthetic toxicity. Patient expressed understanding. A formal time-out was conducted consistent with institution rules.  Monitors were applied, and minimal sedation used (see nursing record). The site was prepped with skin prep and allowed to dry, and sterile gloves were used. A high frequency linear ultrasound probe with probe cover was utilized throughout. C5-7 nerve roots located and appeared anatomically normal, local anesthetic injected around them, and echogenic block needle trajectory was monitored throughout. Aspiration performed every 84ml. Lung and blood vessels were avoided. All injections were performed without resistance and free of blood and paresthesias. The patient tolerated the procedure well.  Injectate: 14ml exparel + 44ml 0.5% bupivacaine

## 2020-08-23 NOTE — Anesthesia Procedure Notes (Signed)
Procedure Name: Intubation Performed by: Malva Cogan, CRNA Pre-anesthesia Checklist: Patient identified, Patient being monitored, Timeout performed, Emergency Drugs available and Suction available Patient Re-evaluated:Patient Re-evaluated prior to induction Oxygen Delivery Method: Circle system utilized Preoxygenation: Pre-oxygenation with 100% oxygen Induction Type: IV induction Ventilation: Mask ventilation without difficulty and Oral airway inserted - appropriate to patient size Laryngoscope Size: 3 and McGraph Grade View: Grade I Tube type: Oral Tube size: 7.5 mm Number of attempts: 1 Airway Equipment and Method: Stylet Placement Confirmation: ETT inserted through vocal cords under direct vision,  positive ETCO2 and breath sounds checked- equal and bilateral Secured at: 23 cm Tube secured with: Tape Dental Injury: Teeth and Oropharynx as per pre-operative assessment

## 2020-08-24 NOTE — Anesthesia Postprocedure Evaluation (Signed)
Anesthesia Post Note  Patient: Sports coach  Procedure(s) Performed: Right shoulder arthroscopic vs mini-open rotator cuff repair, subacromial decompression, distal clavicle excision, and biceps tenodesis - Emeline Gins to Assist (Right )  Patient location during evaluation: PACU Anesthesia Type: General Level of consciousness: awake and alert Pain management: pain level controlled Vital Signs Assessment: post-procedure vital signs reviewed and stable Respiratory status: spontaneous breathing, nonlabored ventilation, respiratory function stable and patient connected to nasal cannula oxygen Cardiovascular status: blood pressure returned to baseline and stable Postop Assessment: no apparent nausea or vomiting Anesthetic complications: no   No complications documented.   Last Vitals:  Vitals:   08/23/20 1745 08/23/20 1836  BP: (!) 135/96 136/78  Pulse: 73 76  Resp: 18 18  Temp: (!) 36.2 C   SpO2: 99% 98%    Last Pain:  Vitals:   08/23/20 1745  TempSrc: Temporal  PainSc:                  Cleda Mccreedy Ethelreda Sukhu

## 2020-08-24 NOTE — Transfer of Care (Signed)
Immediate Anesthesia Transfer of Care Note  Patient: Isaiah Taylor  Procedure(s) Performed: Right shoulder arthroscopic vs mini-open rotator cuff repair, subacromial decompression, distal clavicle excision, and biceps tenodesis - Emeline Gins to Assist (Right )  Patient Location: PACU  Anesthesia Type:General  Level of Consciousness: sedated  Airway & Oxygen Therapy: Patient Spontanous Breathing and Patient connected to face mask oxygen  Post-op Assessment: Report given to RN and Post -op Vital signs reviewed and stable  Post vital signs: Reviewed and stable  Last Vitals:  Vitals Value Taken Time  BP 136/78 08/23/20 1836  Temp 36.2 C 08/23/20 1745  Pulse 76 08/23/20 1836  Resp 18 08/23/20 1836  SpO2 98 % 08/23/20 1836    Last Pain:  Vitals:   08/23/20 1745  TempSrc: Temporal  PainSc:          Complications: No complications documented.

## 2022-06-16 IMAGING — MR MR SHOULDER*R* W/O CM
4 of 5 series · 25 of 40 positions shown · non-contrast
Comparison: Report only from outside radiographs 07/09/2020

CLINICAL DATA: Anterior shoulder pain with decreased range of
motion for 6 months. No acute injury or prior relevant surgery.

EXAM:
MRI OF THE RIGHT SHOULDER WITHOUT CONTRAST
TECHNIQUE: Multiplanar, multisequence MR imaging of the shoulder was performed.
No intravenous contrast was administered.

[Series 6: PD · oblique · right · 4.0mm · 0.44mm/px · 7 of 26 slices shown]
[im 1/26]
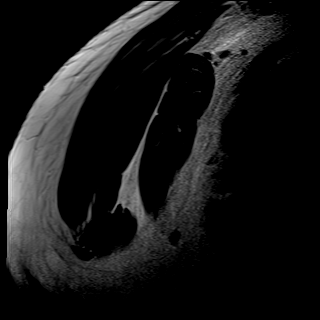
[im 5/26]
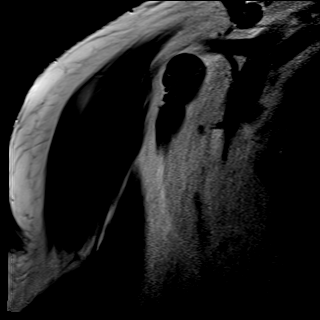
[im 9/26]
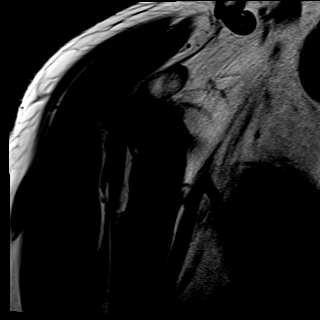
[im 13/26]
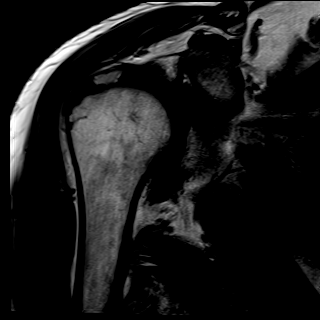
[im 17/26]
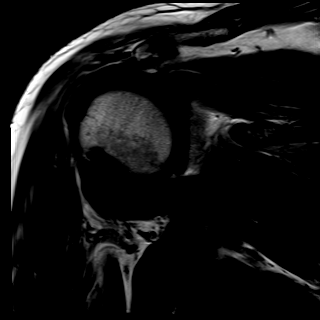
[im 21/26]
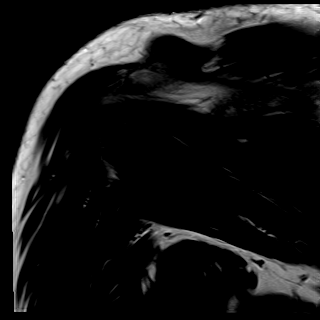
[im 26/26]
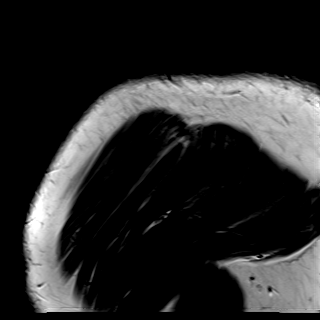

[Series 7: T2 fat-sat · oblique · right · 4.0mm · 0.44mm/px · 8 of 26 slices shown (1 of 3)]
[im 1/26]
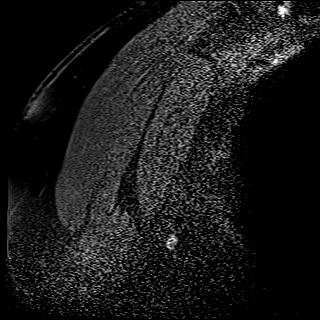
[im 4/26]
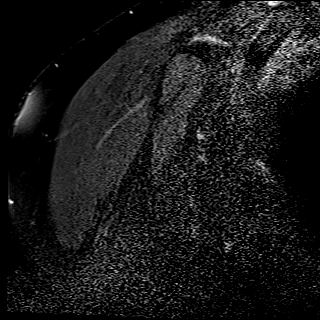
[im 8/26]
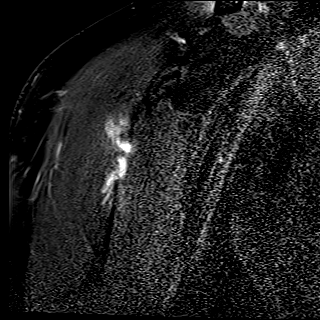
[im 11/26]
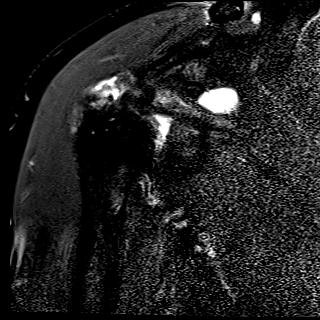
[im 15/26]
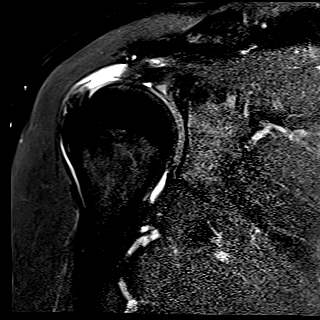
[im 18/26]
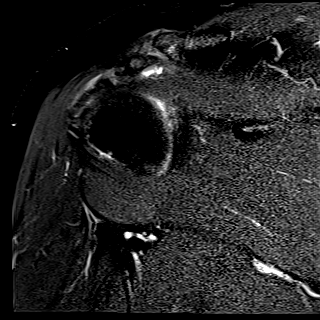
[im 22/26]
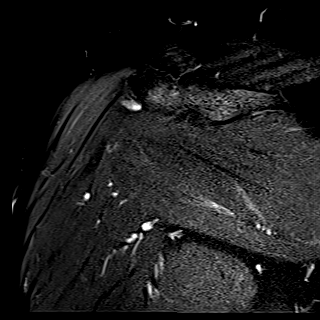
[im 26/26]
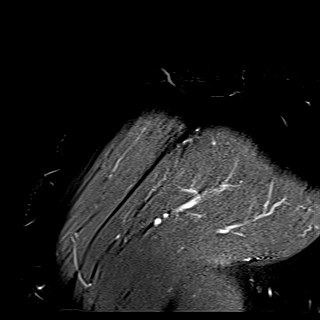

[Series 8: T2 fat-sat · coronal · right · 4.0mm · 0.22mm/px · 7 of 24 slices shown (2 of 3)]
[im 1/24]
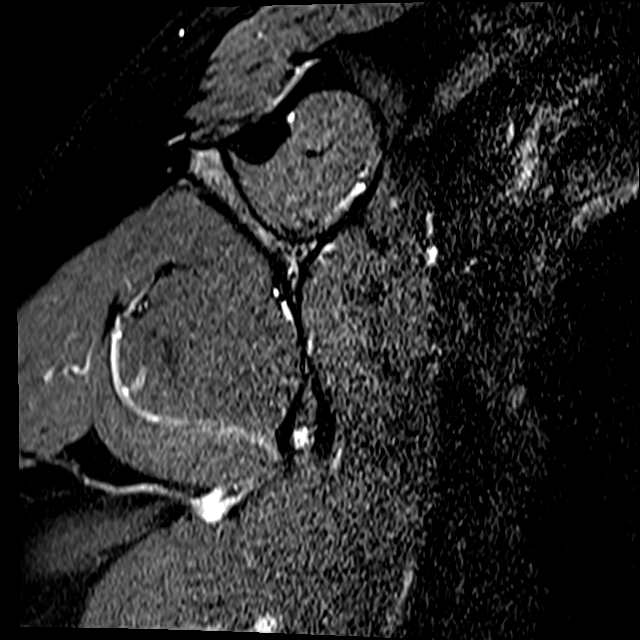
[im 4/24]
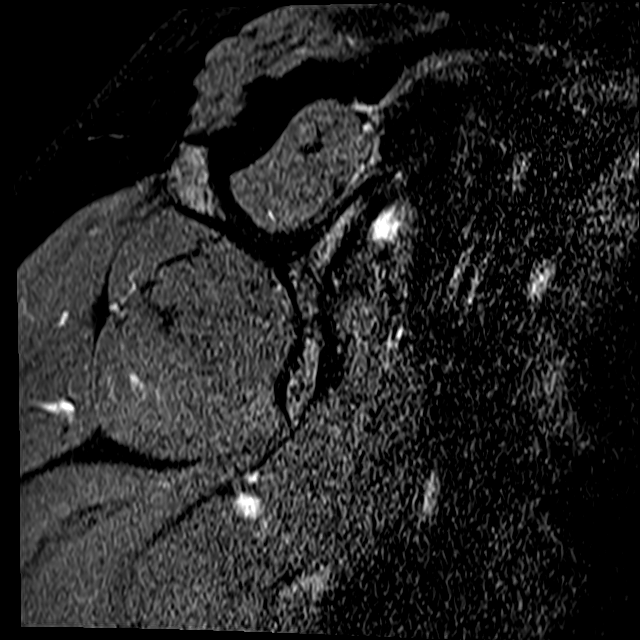
[im 7/24]
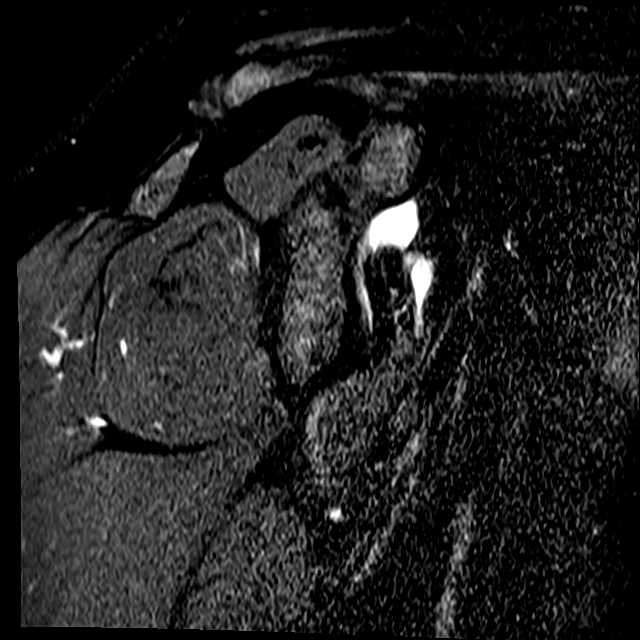
[im 10/24]
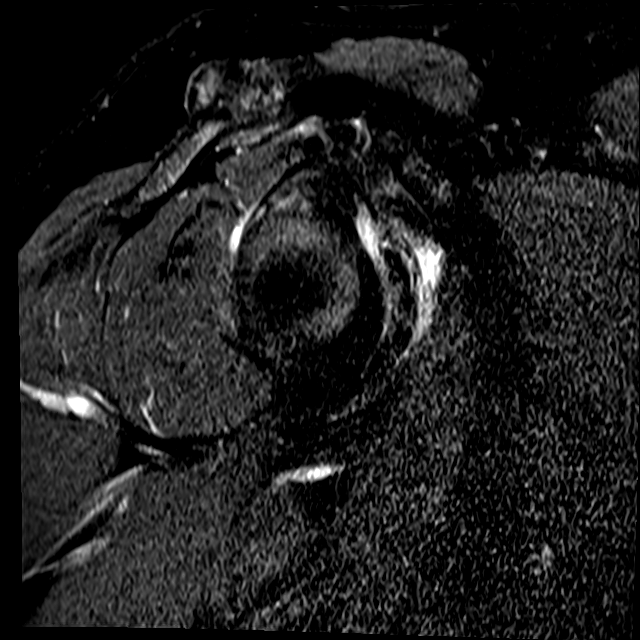
[im 14/24]
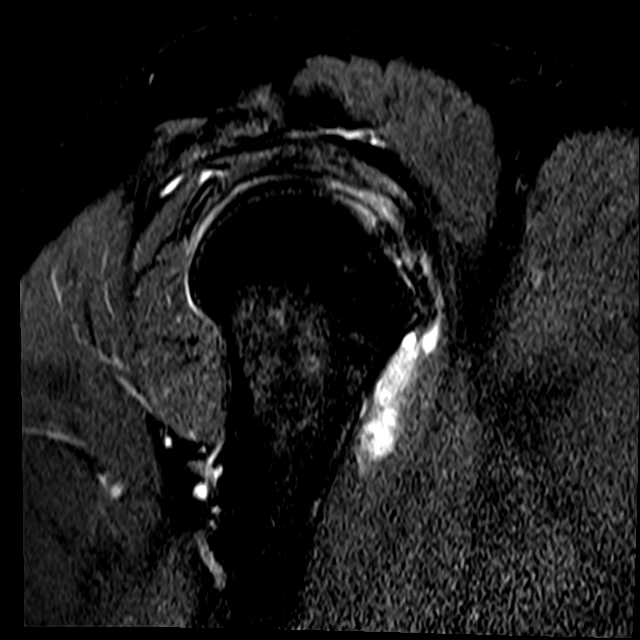
[im 17/24]
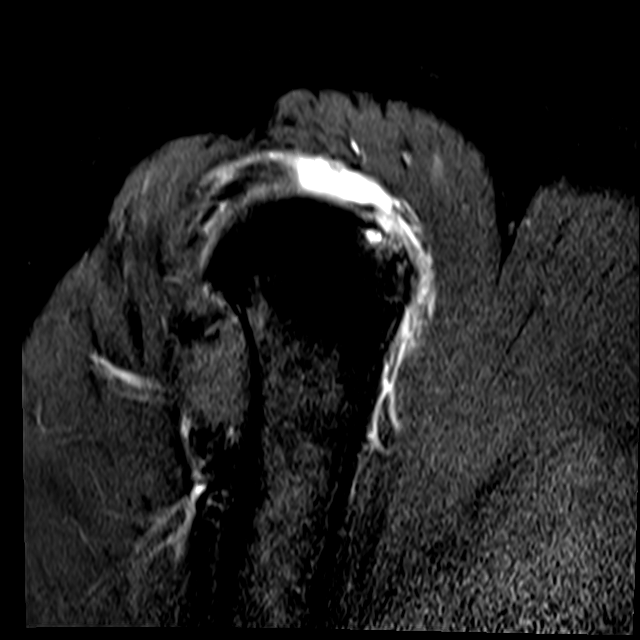
[im 20/24]
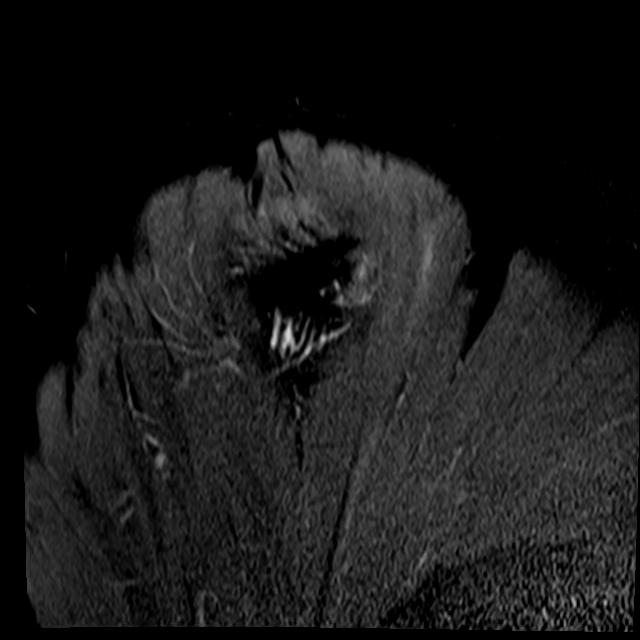

[Series 10: T2 fat-sat · axial · right · 4.0mm · 0.44mm/px · z∈[-86,+4]mm · 3 of 28 slices shown (3 of 3)]
[im 4/28]
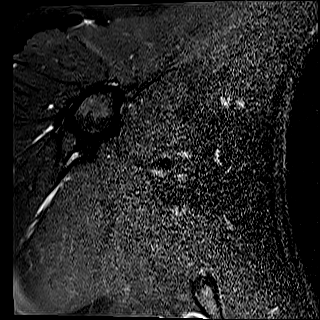
[im 14/28]
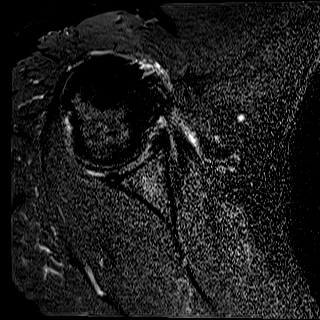
[im 24/28]
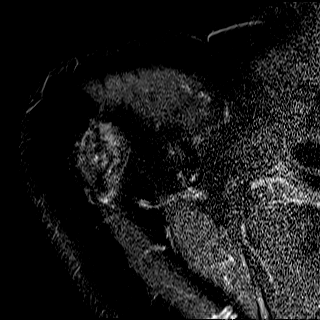

[25 of 40 positions shown; findings below may reference images not displayed]

FINDINGS: Rotator cuff: There is a full-thickness, essentially complete tear
of the supraspinatus tendon in the critical zone. This tear is
associated with 1.5 cm of tendon retraction and measures
approximately 2.1 cm AP. There is mild infraspinatus and
subscapularis tendinosis without tear. The teres minor tendon
appears normal.

Muscles:  No focal muscular atrophy or edema.

Biceps long head: Moderate tendinosis of the intra-articular
portion. The tendon is normally located distally in the bicipital
groove.

Acromioclavicular Joint: The acromion is type 2. There are moderate
acromioclavicular degenerative changes and a small amount of fluid
in the subacromial-subdeltoid and subcoracoid bursa.

Glenohumeral Joint: No significant shoulder joint effusion or
glenohumeral arthropathy. There is mildly prominent fluid within the
biceps tendon sheath and superior subscapularis recess.

Labrum: Labral assessment is limited by the lack of joint fluid. The
superior labrum appears degenerated without well-defined tear or
paralabral cyst. The anterior labrum appears normal.

Bones: No acute or significant extra-articular osseous findings.

Other: No significant soft tissue findings.
IMPRESSION: 1. Full-thickness non insertional tear of the supraspinatus tendon
in the critical zone. Mild tendon retraction without significant
muscular atrophy.
2. The additional components of the rotator cuff are intact,
although there is mild infraspinatus and subscapularis tendinosis.
3. Superior labral degeneration and bicipital tendinosis without
demonstrated tear.

## 2023-03-01 ENCOUNTER — Emergency Department: Payer: Managed Care, Other (non HMO)

## 2023-03-01 ENCOUNTER — Inpatient Hospital Stay
Admission: EM | Admit: 2023-03-01 | Discharge: 2023-03-08 | DRG: 392 | Disposition: A | Discharge status: Home / Self Care | Payer: Managed Care, Other (non HMO) | Attending: Surgery | Admitting: Surgery

## 2023-03-01 ENCOUNTER — Other Ambulatory Visit: Payer: Self-pay

## 2023-03-01 DIAGNOSIS — K5780 Diverticulitis of intestine, part unspecified, with perforation and abscess without bleeding: Secondary | ICD-10-CM | POA: Diagnosis not present

## 2023-03-01 DIAGNOSIS — R7303 Prediabetes: Secondary | ICD-10-CM | POA: Diagnosis present

## 2023-03-01 DIAGNOSIS — K5792 Diverticulitis of intestine, part unspecified, without perforation or abscess without bleeding: Secondary | ICD-10-CM | POA: Diagnosis present

## 2023-03-01 DIAGNOSIS — Z79899 Other long term (current) drug therapy: Secondary | ICD-10-CM

## 2023-03-01 DIAGNOSIS — K5720 Diverticulitis of large intestine with perforation and abscess without bleeding: Principal | ICD-10-CM | POA: Diagnosis present

## 2023-03-01 DIAGNOSIS — Z23 Encounter for immunization: Secondary | ICD-10-CM

## 2023-03-01 LAB — URINALYSIS, ROUTINE W REFLEX MICROSCOPIC
Bilirubin Urine: NEGATIVE
Glucose, UA: NEGATIVE mg/dL
Hgb urine dipstick: NEGATIVE
Ketones, ur: NEGATIVE mg/dL
Leukocytes,Ua: NEGATIVE
Nitrite: NEGATIVE
Protein, ur: NEGATIVE mg/dL
Specific Gravity, Urine: 1.028 (ref 1.005–1.030)
pH: 7 (ref 5.0–8.0)

## 2023-03-01 LAB — COMPREHENSIVE METABOLIC PANEL
ALT: 27 U/L (ref 0–44)
AST: 19 U/L (ref 15–41)
Albumin: 3.8 g/dL (ref 3.5–5.0)
Alkaline Phosphatase: 75 U/L (ref 38–126)
Anion gap: 8 (ref 5–15)
BUN: 11 mg/dL (ref 6–20)
CO2: 22 mmol/L (ref 22–32)
Calcium: 8.4 mg/dL — ABNORMAL LOW (ref 8.9–10.3)
Chloride: 105 mmol/L (ref 98–111)
Creatinine, Ser: 0.52 mg/dL — ABNORMAL LOW (ref 0.61–1.24)
GFR, Estimated: >60 mL/min (ref >60)
Glucose, Bld: 124 mg/dL — ABNORMAL HIGH (ref 70–99)
Potassium: 3.7 mmol/L (ref 3.5–5.1)
Sodium: 135 mmol/L (ref 135–145)
Total Bilirubin: 0.9 mg/dL (ref <1.2)
Total Protein: 7.2 g/dL (ref 6.5–8.1)

## 2023-03-01 LAB — CBC
HCT: 40.8 % (ref 39.0–52.0)
Hemoglobin: 14.1 g/dL (ref 13.0–17.0)
MCH: 28.5 pg (ref 26.0–34.0)
MCHC: 34.6 g/dL (ref 30.0–36.0)
MCV: 82.4 fL (ref 80.0–100.0)
Platelets: 218 10*3/uL (ref 150–400)
RBC: 4.95 MIL/uL (ref 4.22–5.81)
RDW: 13.2 % (ref 11.5–15.5)
WBC: 15 10*3/uL — ABNORMAL HIGH (ref 4.0–10.5)
nRBC: 0 % (ref 0.0–0.2)

## 2023-03-01 LAB — RESP PANEL BY RT-PCR (RSV, FLU A&B, COVID)  RVPGX2
Influenza A by PCR: NEGATIVE
Influenza B by PCR: NEGATIVE
Resp Syncytial Virus by PCR: NEGATIVE
SARS Coronavirus 2 by RT PCR: NEGATIVE

## 2023-03-01 LAB — LIPASE, BLOOD: Lipase: 22 U/L (ref 11–51)

## 2023-03-01 LAB — HIV ANTIBODY (ROUTINE TESTING W REFLEX): HIV Screen 4th Generation wRfx: NONREACTIVE

## 2023-03-01 MED ORDER — MORPHINE SULFATE (PF) 2 MG/ML IV SOLN
2.0000 mg | INTRAVENOUS | Status: DC | PRN
  Administered 2023-03-01: 2 mg via INTRAVENOUS
  Filled 2023-03-01: qty 1

## 2023-03-01 MED ORDER — GABAPENTIN 100 MG PO CAPS: 100.0000 mg | ORAL_CAPSULE | Freq: Three times a day (TID) | ORAL | Status: DC

## 2023-03-01 MED ORDER — HYDROCODONE-ACETAMINOPHEN 5-325 MG PO TABS
1.0000 | ORAL_TABLET | ORAL | Status: DC | PRN
  Administered 2023-03-02 – 2023-03-03 (×2): 2 via ORAL
  Administered 2023-03-04: 1 via ORAL
  Filled 2023-03-01 (×3): qty 2

## 2023-03-01 MED ORDER — PANTOPRAZOLE SODIUM 20 MG PO TBEC
20.0000 mg | DELAYED_RELEASE_TABLET | Freq: Every day | ORAL | Status: DC
  Administered 2023-03-01 – 2023-03-08 (×8): 20 mg via ORAL
  Filled 2023-03-01 (×8): qty 1

## 2023-03-01 MED ORDER — ONDANSETRON 4 MG PO TBDP: 4.0000 mg | ORAL_TABLET | Freq: Four times a day (QID) | ORAL | Status: DC | PRN

## 2023-03-01 MED ORDER — ONDANSETRON HCL 4 MG/2ML IJ SOLN: 4.0000 mg | Freq: Four times a day (QID) | INTRAMUSCULAR | Status: DC | PRN

## 2023-03-01 MED ORDER — MORPHINE SULFATE (PF) 4 MG/ML IV SOLN
4.0000 mg | Freq: Once | INTRAVENOUS | Status: AC
  Administered 2023-03-01: 4 mg via INTRAVENOUS
  Filled 2023-03-01: qty 1

## 2023-03-01 MED ORDER — ENOXAPARIN SODIUM 60 MG/0.6ML IJ SOSY
0.5000 mg/kg | PREFILLED_SYRINGE | INTRAMUSCULAR | Status: DC
  Administered 2023-03-02 – 2023-03-07 (×6): 50 mg via SUBCUTANEOUS
  Filled 2023-03-01 (×6): qty 0.6

## 2023-03-01 MED ORDER — ROSUVASTATIN CALCIUM 20 MG PO TABS: 10.0000 mg | ORAL_TABLET | Freq: Every day | ORAL | Status: DC

## 2023-03-01 MED ORDER — DOCUSATE SODIUM 100 MG PO CAPS: 100.0000 mg | ORAL_CAPSULE | Freq: Two times a day (BID) | ORAL | Status: DC | PRN

## 2023-03-01 MED ORDER — NAPHAZOLINE-GLYCERIN 0.012-0.25 % OP SOLN: 1.0000 [drp] | Freq: Four times a day (QID) | OPHTHALMIC | Status: DC | PRN

## 2023-03-01 MED ORDER — TRAMADOL HCL 50 MG PO TABS: 50.0000 mg | ORAL_TABLET | Freq: Four times a day (QID) | ORAL | Status: DC | PRN

## 2023-03-01 MED ORDER — IOHEXOL 300 MG/ML  SOLN
100.0000 mL | Freq: Once | INTRAMUSCULAR | Status: AC | PRN
  Administered 2023-03-01: 100 mL via INTRAVENOUS

## 2023-03-01 MED ORDER — PIPERACILLIN-TAZOBACTAM 3.375 G IVPB 30 MIN
3.3750 g | Freq: Once | INTRAVENOUS | Status: AC
  Administered 2023-03-01: 3.375 g via INTRAVENOUS
  Filled 2023-03-01 (×2): qty 50

## 2023-03-01 MED ORDER — PIPERACILLIN-TAZOBACTAM 3.375 G IVPB
3.3750 g | Freq: Three times a day (TID) | INTRAVENOUS | Status: DC
  Administered 2023-03-01 – 2023-03-05 (×11): 3.375 g via INTRAVENOUS
  Filled 2023-03-01 (×10): qty 50

## 2023-03-01 MED ORDER — PNEUMOCOCCAL 20-VAL CONJ VACC 0.5 ML IM SUSY
0.5000 mL | PREFILLED_SYRINGE | INTRAMUSCULAR | Status: AC
  Administered 2023-03-08: 0.5 mL via INTRAMUSCULAR
  Filled 2023-03-01: qty 0.5

## 2023-03-01 NOTE — ED Provider Notes (Signed)
[facility] Provider Note    Event Date/Time   First MD Initiated Contact with Patient 03/01/23 0945     (approximate)   History   Abdominal Pain   HPI  Isaiah Taylor is a 47 y.o. male who presents today for evaluation of lower abdominal pain, body aches, and gassy for the past 4 days.  Denies fevers or chills.  He has not had any nausea or vomiting.  He reports that he had some loose stools earlier this week.  He has not had anything to eat today, reports that he has only had some tea which did not help him to feel better.  He denies history of abdominal surgery.  Patient Active Problem List   Diagnosis Date Noted   Diverticulitis 03/01/2023          Physical Exam   Triage Vital Signs: ED Triage Vitals  Encounter Vitals Group     BP 03/01/23 0933 (!) 150/95     Systolic BP Percentile --      Diastolic BP Percentile --      Pulse Rate 03/01/23 0933 99     Resp 03/01/23 0933 18     Temp 03/01/23 0933 98.1 F (36.7 C)     Temp Source 03/01/23 0933 Oral     SpO2 03/01/23 0933 98 %     Weight 03/01/23 0934 225 lb (102.1 kg)     Height 03/01/23 0934 5\' 5"  (1.651 m)     Head Circumference --      Peak Flow --      Pain Score 03/01/23 0934 8     Pain Loc --      Pain Education --      Exclude from Growth Chart --     Most recent vital signs: Vitals:   03/01/23 1214 03/01/23 1337  BP: (!) 148/88   Pulse: 90   Resp: 18   Temp:  99.6 F (37.6 C)  SpO2: 98%     Physical Exam Vitals and nursing note reviewed.  Constitutional:      General: Awake and alert.  Appears uncomfortable    Appearance: Normal appearance. The patient is obese.  HENT:     Head: Normocephalic and atraumatic.     Mouth: Mucous membranes are moist.  Eyes:     General: PERRL. Normal EOMs        Right eye: No discharge.        Left eye: No discharge.     Conjunctiva/sclera: Conjunctivae normal.  Cardiovascular:     Rate and Rhythm: Normal rate and regular  rhythm.     Pulses: Normal pulses.  Pulmonary:     Effort: Pulmonary effort is normal. No respiratory distress.     Breath sounds: Normal breath sounds.  Abdominal:     Abdomen is soft. There is right lower quadrant and left lower quadrant abdominal tenderness. No rebound. Mild guarding present. No distention. Musculoskeletal:        General: No swelling. Normal range of motion.     Cervical back: Normal range of motion and neck supple.  Skin:    General: Skin is warm and dry.     Capillary Refill: Capillary refill takes less than 2 seconds.     Findings: No rash.  Neurological:     Mental Status: The patient is awake and alert.      ED Results / Procedures / Treatments   Labs (all labs ordered are listed, but only  abnormal results are displayed) Labs Reviewed  COMPREHENSIVE METABOLIC PANEL - Abnormal; Notable for the following components:      Result Value   Glucose, Bld 124 (*)    Creatinine, Ser 0.52 (*)    Calcium 8.4 (*)    All other components within normal limits  CBC - Abnormal; Notable for the following components:   WBC 15.0 (*)    All other components within normal limits  URINALYSIS, ROUTINE W REFLEX MICROSCOPIC - Abnormal; Notable for the following components:   Color, Urine STRAW (*)    APPearance CLEAR (*)    All other components within normal limits  RESP PANEL BY RT-PCR (RSV, FLU A&B, COVID)  RVPGX2  LIPASE, BLOOD     EKG     RADIOLOGY I independently reviewed and interpreted imaging and agree with radiologists findings.     PROCEDURES:  Critical Care performed:   Procedures   MEDICATIONS ORDERED IN ED: Medications  piperacillin-tazobactam (ZOSYN) IVPB 3.375 g (3.375 g Intravenous New Bag/Given 03/01/23 1336)  iohexol (OMNIPAQUE) 300 MG/ML solution 100 mL (100 mLs Intravenous Contrast Given 03/01/23 1036)  morphine (PF) 4 MG/ML injection 4 mg (4 mg Intravenous Given 03/01/23 1333)     IMPRESSION / MDM / ASSESSMENT AND PLAN / ED COURSE   I reviewed the triage vital signs and the nursing notes.   Differential diagnosis includes, but is not limited to, gastroenteritis, appendicitis, COVID, influenza, diverticulitis.  I reviewed the patient's chart.  Patient was seen at the [facility] walk-in clinic today for right lower quadrant pain.  He was sent to the emergency department for further evaluation.  Patient is awake and alert, hemodynamically stable and afebrile.  He is nontoxic in appearance, though appears to be uncomfortable.  Labs obtained in triage are revealing for a leukocytosis to 15.  Urine is without evidence of infection.  COVID/flu/RSV swab is negative.  CT scan obtained for further evaluation.   CT reveals sigmoid diverticulitis with perforation, no abscess.  This was immediately discussed with Dr. Tonna Boehringer who has agreed to consult.  Patient was given Zosyn and morphine.  He has been NPO.  Dr. Tonna Boehringer has agreed to admit the patient primarily.  Patient agrees with plan for admission.  Patient's presentation is most consistent with acute presentation with potential threat to life or bodily function.   Clinical Course as of 03/01/23 1358  Thu Mar 01, 2023  1249 Spoke with Dr. Tonna Boehringer, will come to see patient [JP]    Clinical Course User Index [JP] Shadell Brenn, Herb Grays, PA-C     FINAL CLINICAL IMPRESSION(S) / ED DIAGNOSES   Final diagnoses:  Diverticulitis of large intestine with perforation, unspecified bleeding status     Rx / DC Orders   ED Discharge Orders     None        Note:  This document was prepared using Dragon voice recognition software and may include unintentional dictation errors.   Jackelyn Hoehn, PA-C 03/01/23 1358    Merwyn Katos, MD 03/02/23 [phone]

## 2023-03-01 NOTE — ED Triage Notes (Signed)
Pt c/o lower abd pain with urinary frequency x3 days. Pt denies any med hx or medication use

## 2023-03-01 NOTE — H&P (Signed)
Subjective:   CC: Diverticulitis  HPI:  Isaiah Taylor is a 47 y.o. male who was consulted by poggi for issue above.  Symptoms were first noted 3 days ago. Pain is sharp, confined to the left lower quadrant, without radiation.  Associated with nothing, exacerbated by movement.   Past Medical History:  has a past medical history of Pre-diabetes.  Past Surgical History:  Past Surgical History:  Procedure Laterality Date   BACK SURGERY  2014    Family History: family history is not on file.  Social History:  reports that he has never smoked. He has never used smokeless tobacco. He reports current alcohol use. No history on file for drug use.  Current Medications:  Prior to Admission medications   Medication Sig Start Date End Date Taking? Authorizing Provider  Ascorbic Acid (VITAMIN C) 1000 MG tablet Take 1,000 mg by mouth daily.    [provider]  FIBER COMPLETE PO Take 1 capsule by mouth daily.    [provider]  gabapentin (NEURONTIN) 100 MG capsule Take 100 mg by mouth 3 (three) times daily. 03/15/20   [provider]  GELATIN PO Take 1,300 mg by mouth daily.    [provider]  Ginger, Zingiber officinalis, (GINGER ROOT PO) Take 1 capsule by mouth daily.    [provider]  Multiple Vitamins-Minerals (MULTIVITAMIN WITH MINERALS) tablet Take 1 tablet by mouth daily.    [provider]  Omega-3 Fatty Acids (FISH OIL) 1000 MG CAPS Take 1,000 mg by mouth daily.    [provider]  ondansetron (ZOFRAN ODT) 4 MG disintegrating tablet Take 1 tablet (4 mg total) by mouth every 8 (eight) hours as needed for nausea or vomiting. 08/23/20   Signa Kell, MD  Potassium 99 MG TABS Take 99 mg by mouth daily.    [provider]  rosuvastatin (CRESTOR) 10 MG tablet Take 10 mg by mouth at bedtime. 08/02/20   [provider]  tetrahydrozoline 0.05 % ophthalmic solution Place 1 drop into both eyes daily as needed (dry  eyes).    [provider]  traMADol (ULTRAM) 50 MG tablet Take 50 mg by mouth daily as needed for severe pain.    [provider]  vitamin B-12 (CYANOCOBALAMIN) 1000 MCG tablet Take 1,000 mcg by mouth daily.    [provider]    Allergies:  Allergies as of 03/01/2023   (No Known Allergies)    ROS:  General: Denies weight loss, weight gain, fatigue, fevers, chills, and night sweats. Eyes: Denies blurry vision, double vision, eye pain, itchy eyes, and tearing. Ears: Denies hearing loss, earache, and ringing in ears. Nose: Denies sinus pain, congestion, infections, runny nose, and nosebleeds. Mouth/throat: Denies hoarseness, sore throat, bleeding gums, and difficulty swallowing. Heart: Denies chest pain, palpitations, racing heart, irregular heartbeat, leg pain or swelling, and decreased activity tolerance. Respiratory: Denies breathing difficulty, shortness of breath, wheezing, cough, and sputum. GI: Denies change in appetite, heartburn, nausea, vomiting, constipation, diarrhea, and blood in stool. GU: Denies difficulty urinating, pain with urinating, urgency, frequency, blood in urine. Musculoskeletal: Denies joint stiffness, pain, swelling, muscle weakness. Skin: Denies rash, itching, mass, tumors, sores, and boils Neurologic: Denies headache, fainting, dizziness, seizures, numbness, and tingling. Psychiatric: Denies depression, anxiety, difficulty sleeping, and memory loss. Endocrine: Denies heat or cold intolerance, and increased thirst or urination. Blood/lymph: Denies easy bruising, easy bruising, and swollen glands     Objective:     BP (!) 148/88 (BP Location: Left Arm)  Pulse 90   Temp 98.1 F (36.7 C) (Oral)   Resp 18   Ht 5\' 5"  (1.651 m)   Wt 102.1 kg   SpO2 98%   BMI 37.44 kg/m   Constitutional :  alert, cooperative, appears stated age, and no distress  Lymphatics/Throat:  no asymmetry, masses, or scars  Respiratory:  clear to  auscultation bilaterally  Cardiovascular:  regular rate and rhythm  Gastrointestinal: Soft, no guarding, tenderness to palpation noted left lower quadrant .   Musculoskeletal: Steady movement  Skin: Cool and moist   Psychiatric: Normal affect, non-agitated, not confused       LABS:     Latest Ref Rng & Units 03/01/2023    9:38 AM  CMP  Glucose 70 - 99 mg/dL 295   BUN 6 - 20 mg/dL 11   Creatinine 6.21 - 1.24 mg/dL 3.08   Sodium 657 - 846 mmol/L 135   Potassium 3.5 - 5.1 mmol/L 3.7   Chloride 98 - 111 mmol/L 105   CO2 22 - 32 mmol/L 22   Calcium 8.9 - 10.3 mg/dL 8.4   Total Protein 6.5 - 8.1 g/dL 7.2   Total Bilirubin <9.6 mg/dL 0.9   Alkaline Phos 38 - 126 U/L 75   AST 15 - 41 U/L 19   ALT 0 - 44 U/L 27       Latest Ref Rng & Units 03/01/2023    9:38 AM  CBC  WBC 4.0 - 10.5 K/uL 15.0   Hemoglobin 13.0 - 17.0 g/dL 29.5   Hematocrit 28.4 - 52.0 % 40.8   Platelets 150 - 400 K/uL 218     RADS: CLINICAL DATA:  Lower abdominal pain.  Urinary frequency.   EXAM: CT ABDOMEN AND PELVIS WITH CONTRAST   TECHNIQUE: Multidetector CT imaging of the abdomen and pelvis was performed using the standard protocol following bolus administration of intravenous contrast.   RADIATION DOSE REDUCTION: This exam was performed according to the departmental dose-optimization program which includes automated exposure control, adjustment of the mA and/or kV according to patient size and/or use of iterative reconstruction technique.   CONTRAST:  OMNIPAQUE IOHEXOL 300 MG/ML  SOLN   COMPARISON:  None Available.   FINDINGS: Lower chest: Evaluation is limited by motion degradation. No acute abnormality.   Hepatobiliary: No focal liver abnormality is seen. No gallstones, gallbladder wall thickening, or biliary dilatation.   Pancreas: Unremarkable. No pancreatic ductal dilatation or surrounding inflammatory changes.   Spleen: Normal in size without focal abnormality.    Adrenals/Urinary Tract: Adrenal glands are unremarkable. Kidneys are normal, without renal calculi, focal lesion, or hydronephrosis. Bladder is unremarkable.   Stomach/Bowel: Stomach is within normal limits. Appendix appears normal. Descending and sigmoid colonic diverticulosis. There is wall thickening and edema with surrounding pericolonic fat stranding involving an approximately 6 cm segment of the sigmoid colon with foci of air adjacent to the mesenteric wall (series 2, images 59-62). No loculated collection identified.   Vascular/Lymphatic: No significant vascular findings are present. No enlarged abdominal or pelvic lymph nodes.   Reproductive: Unremarkable.   Other: Small bilateral fat containing inguinal hernias. No abdominopelvic ascites.   Musculoskeletal: Postoperative changes related to posterior fusion at L4-S1. Multilevel degenerative changes of the visualized thoracolumbar spine, most pronounced at L3-L4 with grade 1 retrolisthesis. No acute osseous abnormality.   IMPRESSION: Acute sigmoid colonic diverticulitis with localized perforation. No loculated fluid collection identified.   These results were called by telephone at the time of interpretation on 03/01/2023  at 12:43 pm to provider Greig Right, who verbally acknowledged these results.     Electronically Signed   By: Hart Robinsons M.D.   On: 03/01/2023 12:47   Assessment:   Diverticulitis  Plan:   No signs of acute peritonitis.  Will proceed with IV antibiotics, clear liquid diet, serial abdominal exams, and pain control to monitor for any improvement.  The patient verbalized understanding and all questions were answered to the patient's satisfaction.  labs/images/medications/previous chart entries reviewed personally and relevant changes/updates noted above.

## 2023-03-02 LAB — CBC
HCT: 39.9 % (ref 39.0–52.0)
Hemoglobin: 13.9 g/dL (ref 13.0–17.0)
MCH: 28.8 pg (ref 26.0–34.0)
MCHC: 34.8 g/dL (ref 30.0–36.0)
MCV: 82.8 fL (ref 80.0–100.0)
Platelets: 224 10*3/uL (ref 150–400)
RBC: 4.82 MIL/uL (ref 4.22–5.81)
RDW: 13.1 % (ref 11.5–15.5)
WBC: 14.5 10*3/uL — ABNORMAL HIGH (ref 4.0–10.5)
nRBC: 0 % (ref 0.0–0.2)

## 2023-03-02 NOTE — Progress Notes (Signed)
Subjective:  CC: Isaiah Taylor is a 47 y.o. male  Hospital stay day 1,   diverticulitis  HPI: No acute issues overnight.  Patient reports pain is improved.  Hungry.  Had a bowel movement.  ROS:  General: Denies weight loss, weight gain, fatigue, fevers, chills, and night sweats. Heart: Denies chest pain, palpitations, racing heart, irregular heartbeat, leg pain or swelling, and decreased activity tolerance. Respiratory: Denies breathing difficulty, shortness of breath, wheezing, cough, and sputum. GI: Denies change in appetite, heartburn, nausea, vomiting, constipation, diarrhea, and blood in stool. GU: Denies difficulty urinating, pain with urinating, urgency, frequency, blood in urine.   Objective:   Temp:  [98.4 F (36.9 C)-100.1 F (37.8 C)] 98.4 F (36.9 C) (11/08 0840) Pulse Rate:  [80-100] 80 (11/08 0840) Resp:  [15-20] 18 (11/08 0840) BP: (108-148)/(78-101) 108/78 (11/08 0840) SpO2:  [97 %-100 %] 98 % (11/08 0840)     Height: 5\' 5"  (165.1 cm) Weight: 102.1 kg BMI (Calculated): 37.44   Intake/Output this shift:   Intake/Output Summary (Last 24 hours) at 03/02/2023 1045 Last data filed at 03/02/2023 0900 Gross per 24 hour  Intake 340 ml  Output --  Net 340 ml    Constitutional :  alert, cooperative, appears stated age, and no distress  Respiratory:  clear to auscultation bilaterally  Cardiovascular:  regular rate and rhythm  Gastrointestinal: Soft, no guarding, minimal tenderness to palpation left lower quadrant and suprapubic area .   Skin: Cool and moist.   Psychiatric: Normal affect, non-agitated, not confused       LABS:     Latest Ref Rng & Units 03/01/2023    9:38 AM  CMP  Glucose 70 - 99 mg/dL 169   BUN 6 - 20 mg/dL 11   Creatinine 6.78 - 1.24 mg/dL 9.38   Sodium 101 - 751 mmol/L 135   Potassium 3.5 - 5.1 mmol/L 3.7   Chloride 98 - 111 mmol/L 105   CO2 22 - 32 mmol/L 22   Calcium 8.9 - 10.3 mg/dL 8.4   Total Protein 6.5 - 8.1 g/dL 7.2   Total  Bilirubin <1.2 mg/dL 0.9   Alkaline Phos 38 - 126 U/L 75   AST 15 - 41 U/L 19   ALT 0 - 44 U/L 27       Latest Ref Rng & Units 03/02/2023    1:51 AM 03/01/2023    9:38 AM  CBC  WBC 4.0 - 10.5 K/uL 14.5  15.0   Hemoglobin 13.0 - 17.0 g/dL 02.5  85.2   Hematocrit 39.0 - 52.0 % 39.9  40.8   Platelets 150 - 400 K/uL 224  218     RADS: N/a Assessment:   Diverticulitis.  Improving based on clinical exam and slightly lower white count today.  Recommend continuing clear liquids for another day along with IV antibiotics.  Serial abdominal exams and reassess in the morning.  labs/images/medications/previous chart entries reviewed personally and relevant changes/updates noted above.

## 2023-03-02 NOTE — Plan of Care (Signed)

## 2023-03-02 NOTE — TOC CM/SW Note (Signed)
  Transition of Care [facility]) Screening Note   Patient Details  Name: Isaiah Taylor Date of Birth: August 20, 1975   Transition of Care [facility]) CM/SW Contact:    Garret Reddish, RN Phone Number: 03/02/2023, 11:00 AM    Transition of Care Department [facility]) has reviewed patient and no TOC needs have been identified at this time. We will continue to monitor patient advancement through interdisciplinary progression rounds. If new patient transition needs arise, please place a TOC consult.  Chart reviewed.  Noted that patient was admitted with Diverticulitis.  Patient is currently on IV Zosyn. Patient is also tolerating clear liquid diet.    No TOC needs identified.

## 2023-03-03 DIAGNOSIS — K5780 Diverticulitis of intestine, part unspecified, with perforation and abscess without bleeding: Secondary | ICD-10-CM | POA: Diagnosis not present

## 2023-03-03 LAB — CBC
HCT: 42.3 % (ref 39.0–52.0)
Hemoglobin: 14.2 g/dL (ref 13.0–17.0)
MCH: 28.7 pg (ref 26.0–34.0)
MCHC: 33.6 g/dL (ref 30.0–36.0)
MCV: 85.5 fL (ref 80.0–100.0)
Platelets: 254 10*3/uL (ref 150–400)
RBC: 4.95 MIL/uL (ref 4.22–5.81)
RDW: 12.8 % (ref 11.5–15.5)
WBC: 7.8 10*3/uL (ref 4.0–10.5)
nRBC: 0 % (ref 0.0–0.2)

## 2023-03-03 LAB — BASIC METABOLIC PANEL
Anion gap: 9 (ref 5–15)
BUN: 10 mg/dL (ref 6–20)
CO2: 26 mmol/L (ref 22–32)
Calcium: 8.9 mg/dL (ref 8.9–10.3)
Chloride: 102 mmol/L (ref 98–111)
Creatinine, Ser: 0.78 mg/dL (ref 0.61–1.24)
GFR, Estimated: >60 mL/min (ref >60)
Glucose, Bld: 108 mg/dL — ABNORMAL HIGH (ref 70–99)
Potassium: 4.1 mmol/L (ref 3.5–5.1)
Sodium: 137 mmol/L (ref 135–145)

## 2023-03-03 NOTE — Plan of Care (Signed)

## 2023-03-03 NOTE — Progress Notes (Signed)
03/03/2023  Subjective: No acute events overnight.  Patient reports that his pain in the lower abdomen is improving today.  His white blood cell count normalized down to 7.8.  Denies any nausea with the clear liquids.  Vital signs: Temp:  [98 F (36.7 C)-98.2 F (36.8 C)] 98 F (36.7 C) (11/09 0759) Pulse Rate:  [73-75] 74 (11/09 0759) Resp:  [16-20] 16 (11/09 0759) BP: (123-132)/(74-79) 132/74 (11/09 0759) SpO2:  [98 %-99 %] 98 % (11/09 0759)   Intake/Output: 11/08 0701 - 11/09 0700 In: 480 [P.O.:480] Out: -  Last BM Date : 03/02/23  Physical Exam: Constitutional: No acute distress Abdomen: Soft, nondistended, with mild soreness in the lower abdomen.  No peritonitis.  Labs:  Recent Labs    03/02/23 0151 03/03/23 0637  WBC 14.5* 7.8  HGB 13.9 14.2  HCT 39.9 42.3  PLT 224 254   Recent Labs    03/01/23 0938 03/03/23 0637  NA 135 137  K 3.7 4.1  CL 105 102  CO2 22 26  GLUCOSE 124* 108*  BUN 11 10  CREATININE 0.52* 0.78  CALCIUM 8.4* 8.9   No results for input(s): "LABPROT", "INR" in the last 72 hours.  Imaging: No results found.  Assessment/Plan: This is a 47 y.o. male with perforated acute diverticulitis.  - Patient has improved clinically between yesterday and today with a normalized white blood cell count and improving pain in the lower abdomen.  He has tolerated clear liquid diet.  As such, will advance him to a full liquid diet today.  Potentially could advance to a soft diet for dinner but we will gauge this later on today. - Continue IV antibiotics for his diverticulitis. - Okay for patient to ambulate.   I spent 35 minutes dedicated to the care of this patient on the date of this encounter to include pre-visit review of records, face-to-face time with the patient discussing diagnosis and management, and any post-visit coordination of care.  Howie Ill, MD [facility]

## 2023-03-04 DIAGNOSIS — K5780 Diverticulitis of intestine, part unspecified, with perforation and abscess without bleeding: Secondary | ICD-10-CM | POA: Diagnosis not present

## 2023-03-04 LAB — CBC
HCT: 42 % (ref 39.0–52.0)
Hemoglobin: 14.6 g/dL (ref 13.0–17.0)
MCH: 28.6 pg (ref 26.0–34.0)
MCHC: 34.8 g/dL (ref 30.0–36.0)
MCV: 82.2 fL (ref 80.0–100.0)
Platelets: 279 10*3/uL (ref 150–400)
RBC: 5.11 MIL/uL (ref 4.22–5.81)
RDW: 12.6 % (ref 11.5–15.5)
WBC: 8.3 10*3/uL (ref 4.0–10.5)
nRBC: 0 % (ref 0.0–0.2)

## 2023-03-04 LAB — BASIC METABOLIC PANEL
Anion gap: 9 (ref 5–15)
BUN: 12 mg/dL (ref 6–20)
CO2: 24 mmol/L (ref 22–32)
Calcium: 8.8 mg/dL — ABNORMAL LOW (ref 8.9–10.3)
Chloride: 104 mmol/L (ref 98–111)
Creatinine, Ser: 0.68 mg/dL (ref 0.61–1.24)
GFR, Estimated: >60 mL/min (ref >60)
Glucose, Bld: 103 mg/dL — ABNORMAL HIGH (ref 70–99)
Potassium: 4 mmol/L (ref 3.5–5.1)
Sodium: 137 mmol/L (ref 135–145)

## 2023-03-04 NOTE — Plan of Care (Signed)

## 2023-03-04 NOTE — Progress Notes (Signed)
03/04/2023  Subjective: Patient reports feeling some bloatedness with a full liquid diet but reports may be related to the milk products as he feels he has some degree of lactose intolerance.  He has been passing flatus but has not had a bowel movement.  Denies any worsening pain or nausea.  WBC today remains normal.  Vital signs: Temp:  [98.4 F (36.9 C)-99.2 F (37.3 C)] 99.2 F (37.3 C) (11/10 0741) Pulse Rate:  [74-87] 74 (11/10 0741) Resp:  [16-18] 18 (11/10 0741) BP: (118-127)/(86-91) 118/89 (11/10 0741) SpO2:  [95 %-98 %] 98 % (11/10 0741)   Intake/Output: 11/09 0701 - 11/10 0700 In: 199.1 [IV Piggyback:199.1] Out: -  Last BM Date : 03/02/23  Physical Exam: Constitutional: No acute distress Abdomen: Soft, nondistended, with some soreness to palpation in the lower abdomen.  No peritonitis or worsening exam compared to yesterday.  Labs:  Recent Labs    03/03/23 0637 03/04/23 0305  WBC 7.8 8.3  HGB 14.2 14.6  HCT 42.3 42.0  PLT 254 279   Recent Labs    03/03/23 0637 03/04/23 0305  NA 137 137  K 4.1 4.0  CL 102 104  CO2 26 24  GLUCOSE 108* 103*  BUN 10 12  CREATININE 0.78 0.68  CALCIUM 8.9 8.8*   No results for input(s): "LABPROT", "INR" in the last 72 hours.  Imaging: No results found.  Assessment/Plan: This is a 47 y.o. male with perforated diverticulitis.  - Clinically the patient still is doing well with stable and improving pain without any nausea, fevers, worsening WBC.  Discussed with him that if he is somewhat lactose intolerant, a full liquid diet may not be settling well with him.  Given that he is clinically doing okay, will advance him to a soft diet today for breakfast. - Discussed with patient that if over the course of the day he feels better and continues to do well, we could potentially discharge him home late afternoon.  However if there is any issues, we can certainly keep him overnight and reassess in the morning.   I spent 35 minutes  dedicated to the care of this patient on the date of this encounter to include pre-visit review of records, face-to-face time with the patient discussing diagnosis and management, and any post-visit coordination of care.  Howie Ill, MD  [facility]

## 2023-03-04 NOTE — Plan of Care (Signed)

## 2023-03-05 ENCOUNTER — Inpatient Hospital Stay: Payer: Managed Care, Other (non HMO)

## 2023-03-05 LAB — CBC
HCT: 41.2 % (ref 39.0–52.0)
Hemoglobin: 14.1 g/dL (ref 13.0–17.0)
MCH: 28.1 pg (ref 26.0–34.0)
MCHC: 34.2 g/dL (ref 30.0–36.0)
MCV: 82.1 fL (ref 80.0–100.0)
Platelets: 301 10*3/uL (ref 150–400)
RBC: 5.02 MIL/uL (ref 4.22–5.81)
RDW: 12.5 % (ref 11.5–15.5)
WBC: 10.9 10*3/uL — ABNORMAL HIGH (ref 4.0–10.5)
nRBC: 0 % (ref 0.0–0.2)

## 2023-03-05 LAB — BASIC METABOLIC PANEL
Anion gap: 5 (ref 5–15)
BUN: 12 mg/dL (ref 6–20)
CO2: 25 mmol/L (ref 22–32)
Calcium: 8.7 mg/dL — ABNORMAL LOW (ref 8.9–10.3)
Chloride: 105 mmol/L (ref 98–111)
Creatinine, Ser: 0.91 mg/dL (ref 0.61–1.24)
GFR, Estimated: >60 mL/min (ref >60)
Glucose, Bld: 111 mg/dL — ABNORMAL HIGH (ref 70–99)
Potassium: 3.9 mmol/L (ref 3.5–5.1)
Sodium: 135 mmol/L (ref 135–145)

## 2023-03-05 MED ORDER — IOHEXOL 300 MG/ML  SOLN
30.0000 mL | Freq: Once | INTRAMUSCULAR | Status: AC | PRN
  Administered 2023-03-05 (×2): 30 mL via ORAL

## 2023-03-05 MED ORDER — IOHEXOL 300 MG/ML  SOLN
100.0000 mL | Freq: Once | INTRAMUSCULAR | Status: AC | PRN
  Administered 2023-03-05: 100 mL via INTRAVENOUS

## 2023-03-05 MED ORDER — SODIUM CHLORIDE 0.9 % IV SOLN
1.0000 g | INTRAVENOUS | Status: DC
  Administered 2023-03-05 – 2023-03-07 (×3): 1 g via INTRAVENOUS
  Filled 2023-03-05 (×4): qty 1000

## 2023-03-05 NOTE — Progress Notes (Signed)
Subjective:  CC: Isaiah Taylor is a 47 y.o. male  Hospital stay day 4,   diverticulitis  HPI: Pain has worsened overnight.  ROS:  General: Denies weight loss, weight gain, fatigue, fevers, chills, and night sweats. Heart: Denies chest pain, palpitations, racing heart, irregular heartbeat, leg pain or swelling, and decreased activity tolerance. Respiratory: Denies breathing difficulty, shortness of breath, wheezing, cough, and sputum. GI: Denies change in appetite, heartburn, nausea, vomiting, constipation, diarrhea, and blood in stool. GU: Denies difficulty urinating, pain with urinating, urgency, frequency, blood in urine.   Objective:   Temp:  [97.6 F (36.4 C)-98 F (36.7 C)] 97.6 F (36.4 C) (11/11 0747) Pulse Rate:  [70-90] 70 (11/11 0747) Resp:  [18] 18 (11/11 0747) BP: (117-133)/(75-97) 117/75 (11/11 0747) SpO2:  [96 %-99 %] 96 % (11/11 0747)     Height: 5\' 5"  (165.1 cm) Weight: 102.1 kg BMI (Calculated): 37.44   Intake/Output this shift:   Intake/Output Summary (Last 24 hours) at 03/05/2023 1259 Last data filed at 03/05/2023 0500 Gross per 24 hour  Intake 100.67 ml  Output 1 ml  Net 99.67 ml    Constitutional :  alert, cooperative, appears stated age, and no distress  Respiratory:  clear to auscultation bilaterally  Cardiovascular:  regular rate and rhythm  Gastrointestinal: Soft, no guarding, minimal tenderness to palpation left lower quadrant and suprapubic area .   Skin: Cool and moist.   Psychiatric: Normal affect, non-agitated, not confused       LABS:     Latest Ref Rng & Units 03/05/2023    3:02 AM 03/04/2023    3:05 AM 03/03/2023    6:37 AM  CMP  Glucose 70 - 99 mg/dL 469  629  528   BUN 6 - 20 mg/dL 12  12  10    Creatinine 0.61 - 1.24 mg/dL 4.13  2.44  0.10   Sodium 135 - 145 mmol/L 135  137  137   Potassium 3.5 - 5.1 mmol/L 3.9  4.0  4.1   Chloride 98 - 111 mmol/L 105  104  102   CO2 22 - 32 mmol/L 25  24  26    Calcium 8.9 - 10.3 mg/dL  8.7  8.8  8.9       Latest Ref Rng & Units 03/05/2023    3:02 AM 03/04/2023    3:05 AM 03/03/2023    6:37 AM  CBC  WBC 4.0 - 10.5 K/uL 10.9  8.3  7.8   Hemoglobin 13.0 - 17.0 g/dL 27.2  53.6  64.4   Hematocrit 39.0 - 52.0 % 41.2  42.0  42.3   Platelets 150 - 400 K/uL 301  279  254     RADS: Pending official CT report read of repeat CT this a.m. Assessment:   Diverticulitis.  Recurrent pain in suprapubic area again although clinical exam still has no sign of peritonitis.  Slight increase in white count noted as well.  Interpretation of CT images personally notes grossly unchanged if not slightly worse sigmoid diverticulitis with no obvious abscess formation.    Discussed with patient and family member proceeding with surgery versus adjusting antibiotics and continuing to monitor the area to see if drainable abscess forms over the next few days.  Ultimately requested observation for another day or 2 to see if any changes.  Will go back to clear liquid diet to minimize colon stimulation and change antibiotic to invanz to see if this allows any improvement.  labs/images/medications/previous chart entries reviewed personally  and relevant changes/updates noted above.

## 2023-03-05 NOTE — Plan of Care (Signed)

## 2023-03-06 LAB — BASIC METABOLIC PANEL
Anion gap: 8 (ref 5–15)
BUN: 10 mg/dL (ref 6–20)
CO2: 26 mmol/L (ref 22–32)
Calcium: 9.2 mg/dL (ref 8.9–10.3)
Chloride: 104 mmol/L (ref 98–111)
Creatinine, Ser: 0.75 mg/dL (ref 0.61–1.24)
GFR, Estimated: >60 mL/min (ref >60)
Glucose, Bld: 111 mg/dL — ABNORMAL HIGH (ref 70–99)
Potassium: 4.6 mmol/L (ref 3.5–5.1)
Sodium: 138 mmol/L (ref 135–145)

## 2023-03-06 LAB — CBC
HCT: 42.4 % (ref 39.0–52.0)
Hemoglobin: 14.5 g/dL (ref 13.0–17.0)
MCH: 28.9 pg (ref 26.0–34.0)
MCHC: 34.2 g/dL (ref 30.0–36.0)
MCV: 84.5 fL (ref 80.0–100.0)
Platelets: 287 10*3/uL (ref 150–400)
RBC: 5.02 MIL/uL (ref 4.22–5.81)
RDW: 12.7 % (ref 11.5–15.5)
WBC: 9.2 10*3/uL (ref 4.0–10.5)
nRBC: 0 % (ref 0.0–0.2)

## 2023-03-06 NOTE — Plan of Care (Signed)

## 2023-03-06 NOTE — Progress Notes (Signed)
Subjective:  CC: Isaiah Taylor is a 47 y.o. male  Hospital stay day 5,   diverticulitis  HPI: Pain improved overnight.  Tolerating clears. ROS:  General: Denies weight loss, weight gain, fatigue, fevers, chills, and night sweats. Heart: Denies chest pain, palpitations, racing heart, irregular heartbeat, leg pain or swelling, and decreased activity tolerance. Respiratory: Denies breathing difficulty, shortness of breath, wheezing, cough, and sputum. GI: Denies change in appetite, heartburn, nausea, vomiting, constipation, diarrhea, and blood in stool. GU: Denies difficulty urinating, pain with urinating, urgency, frequency, blood in urine.   Objective:   Temp:  [98 F (36.7 C)-98.7 F (37.1 C)] 98 F (36.7 C) (11/12 0742) Pulse Rate:  [72-91] 72 (11/12 0742) Resp:  [16-18] 17 (11/12 0742) BP: (117-124)/(77-96) 117/77 (11/12 0742) SpO2:  [97 %-99 %] 99 % (11/12 0742)     Height: 5\' 5"  (165.1 cm) Weight: 102.1 kg BMI (Calculated): 37.44   Intake/Output this shift:   Intake/Output Summary (Last 24 hours) at 03/06/2023 0847 Last data filed at 03/06/2023 0323 Gross per 24 hour  Intake 100 ml  Output --  Net 100 ml    Constitutional :  alert, cooperative, appears stated age, and no distress  Respiratory:  clear to auscultation bilaterally  Cardiovascular:  regular rate and rhythm  Gastrointestinal: Soft, no guarding, minimal tenderness to palpation left lower quadrant and suprapubic area .   Skin: Cool and moist.   Psychiatric: Normal affect, non-agitated, not confused       LABS:     Latest Ref Rng & Units 03/06/2023    6:05 AM 03/05/2023    3:02 AM 03/04/2023    3:05 AM  CMP  Glucose 70 - 99 mg/dL 606  301  601   BUN 6 - 20 mg/dL 10  12  12    Creatinine 0.61 - 1.24 mg/dL 0.93  2.35  5.73   Sodium 135 - 145 mmol/L 138  135  137   Potassium 3.5 - 5.1 mmol/L 4.6  3.9  4.0   Chloride 98 - 111 mmol/L 104  105  104   CO2 22 - 32 mmol/L 26  25  24    Calcium 8.9 - 10.3  mg/dL 9.2  8.7  8.8       Latest Ref Rng & Units 03/06/2023    6:05 AM 03/05/2023    3:02 AM 03/04/2023    3:05 AM  CBC  WBC 4.0 - 10.5 K/uL 9.2  10.9  8.3   Hemoglobin 13.0 - 17.0 g/dL 22.0  25.4  27.0   Hematocrit 39.0 - 52.0 % 42.4  41.2  42.0   Platelets 150 - 400 K/uL 287  301  279     RADS: Narrative & Impression  CLINICAL DATA:  Follow-up sigmoid diverticulitis with perforation.   EXAM: CT ABDOMEN AND PELVIS WITH CONTRAST   TECHNIQUE: Multidetector CT imaging of the abdomen and pelvis was performed using the standard protocol following bolus administration of intravenous contrast.   RADIATION DOSE REDUCTION: This exam was performed according to the departmental dose-optimization program which includes automated exposure control, adjustment of the mA and/or kV according to patient size and/or use of iterative reconstruction technique.   CONTRAST:  OMNIPAQUE IOHEXOL 300 MG/ML  SOLN   COMPARISON:  03/01/2023   FINDINGS: Lower Chest: No acute findings.   Hepatobiliary: No suspicious hepatic masses identified. Gallbladder is unremarkable. No evidence of biliary ductal dilatation.   Pancreas:  No mass or inflammatory changes.   Spleen: Within normal  limits in size and appearance.   Adrenals/Urinary Tract: No suspicious masses identified. No evidence of ureteral calculi or hydronephrosis.   Stomach/Bowel: Moderate sigmoid diverticulitis is again seen, with mild increase in inflammatory change and extraluminal air bubbles in the sigmoid mesocolon. No evidence of abscess or bowel obstruction. No evidence of free intraperitoneal air.   Vascular/Lymphatic: No pathologically enlarged lymph nodes. No acute vascular findings.   Reproductive:  No mass or other significant abnormality.   Other:  None.   Musculoskeletal:  No suspicious bone lesions identified.   IMPRESSION: Mild increase in moderate sigmoid diverticulitis with localized perforation. No  evidence of abscess or free air.     Electronically Signed   By: Danae Orleans M.D.   On: 03/05/2023 16:35   Assessment:   Diverticulitis.  Recurrent pain in suprapubic area yesterday.  Improved overnight on clear liquid diet.  White count again has normalized.  Will continue with Invanz and clear liquid diet per patient request for another day.   labs/images/medications/previous chart entries reviewed personally and relevant changes/updates noted above.

## 2023-03-07 LAB — CBC
HCT: 43 % (ref 39.0–52.0)
Hemoglobin: 14.8 g/dL (ref 13.0–17.0)
MCH: 28.5 pg (ref 26.0–34.0)
MCHC: 34.4 g/dL (ref 30.0–36.0)
MCV: 82.9 fL (ref 80.0–100.0)
Platelets: 304 10*3/uL (ref 150–400)
RBC: 5.19 MIL/uL (ref 4.22–5.81)
RDW: 12.5 % (ref 11.5–15.5)
WBC: 7.8 10*3/uL (ref 4.0–10.5)
nRBC: 0 % (ref 0.0–0.2)

## 2023-03-07 LAB — BASIC METABOLIC PANEL
Anion gap: 11 (ref 5–15)
BUN: 10 mg/dL (ref 6–20)
CO2: 25 mmol/L (ref 22–32)
Calcium: 9.1 mg/dL (ref 8.9–10.3)
Chloride: 102 mmol/L (ref 98–111)
Creatinine, Ser: 0.88 mg/dL (ref 0.61–1.24)
GFR, Estimated: >60 mL/min (ref >60)
Glucose, Bld: 104 mg/dL — ABNORMAL HIGH (ref 70–99)
Potassium: 4 mmol/L (ref 3.5–5.1)
Sodium: 138 mmol/L (ref 135–145)

## 2023-03-07 NOTE — Progress Notes (Signed)
Subjective:  CC: Isaiah Taylor is a 47 y.o. male  Hospital stay day 6,   diverticulitis  HPI: Pain improved again overnight.  Tolerating full liquid.  ROS:  General: Denies weight loss, weight gain, fatigue, fevers, chills, and night sweats. Heart: Denies chest pain, palpitations, racing heart, irregular heartbeat, leg pain or swelling, and decreased activity tolerance. Respiratory: Denies breathing difficulty, shortness of breath, wheezing, cough, and sputum. GI: Denies change in appetite, heartburn, nausea, vomiting, constipation, diarrhea, and blood in stool. GU: Denies difficulty urinating, pain with urinating, urgency, frequency, blood in urine.   Objective:   Temp:  [97.5 F (36.4 C)-98.1 F (36.7 C)] 97.5 F (36.4 C) (11/13 1427) Pulse Rate:  [73-75] 75 (11/13 1427) Resp:  [17-18] 18 (11/13 1427) BP: (112-116)/(76-83) 116/76 (11/13 1427) SpO2:  [97 %-99 %] 97 % (11/13 1427)     Height: 5\' 5"  (165.1 cm) Weight: 102.1 kg BMI (Calculated): 37.44   Intake/Output this shift:  No intake or output data in the 24 hours ending 03/07/23 1733   Constitutional :  alert, cooperative, appears stated age, and no distress  Respiratory:  clear to auscultation bilaterally  Cardiovascular:  regular rate and rhythm  Gastrointestinal: Soft, no guarding, minimal tenderness to palpation left lower quadrant and suprapubic area .   Skin: Cool and moist.   Psychiatric: Normal affect, non-agitated, not confused       LABS:     Latest Ref Rng & Units 03/07/2023    5:19 AM 03/06/2023    6:05 AM 03/05/2023    3:02 AM  CMP  Glucose 70 - 99 mg/dL 952  841  324   BUN 6 - 20 mg/dL 10  10  12    Creatinine 0.61 - 1.24 mg/dL 4.01  0.27  2.53   Sodium 135 - 145 mmol/L 138  138  135   Potassium 3.5 - 5.1 mmol/L 4.0  4.6  3.9   Chloride 98 - 111 mmol/L 102  104  105   CO2 22 - 32 mmol/L 25  26  25    Calcium 8.9 - 10.3 mg/dL 9.1  9.2  8.7       Latest Ref Rng & Units 03/07/2023    5:19 AM  03/06/2023    6:05 AM 03/05/2023    3:02 AM  CBC  WBC 4.0 - 10.5 K/uL 7.8  9.2  10.9   Hemoglobin 13.0 - 17.0 g/dL 66.4  40.3  47.4   Hematocrit 39.0 - 52.0 % 43.0  42.4  41.2   Platelets 150 - 400 K/uL 304  287  301     RADS: Narrative & Impression  CLINICAL DATA:  Follow-up sigmoid diverticulitis with perforation.   EXAM: CT ABDOMEN AND PELVIS WITH CONTRAST   TECHNIQUE: Multidetector CT imaging of the abdomen and pelvis was performed using the standard protocol following bolus administration of intravenous contrast.   RADIATION DOSE REDUCTION: This exam was performed according to the departmental dose-optimization program which includes automated exposure control, adjustment of the mA and/or kV according to patient size and/or use of iterative reconstruction technique.   CONTRAST:  OMNIPAQUE IOHEXOL 300 MG/ML  SOLN   COMPARISON:  03/01/2023   FINDINGS: Lower Chest: No acute findings.   Hepatobiliary: No suspicious hepatic masses identified. Gallbladder is unremarkable. No evidence of biliary ductal dilatation.   Pancreas:  No mass or inflammatory changes.   Spleen: Within normal limits in size and appearance.   Adrenals/Urinary Tract: No suspicious masses identified. No evidence of  ureteral calculi or hydronephrosis.   Stomach/Bowel: Moderate sigmoid diverticulitis is again seen, with mild increase in inflammatory change and extraluminal air bubbles in the sigmoid mesocolon. No evidence of abscess or bowel obstruction. No evidence of free intraperitoneal air.   Vascular/Lymphatic: No pathologically enlarged lymph nodes. No acute vascular findings.   Reproductive:  No mass or other significant abnormality.   Other:  None.   Musculoskeletal:  No suspicious bone lesions identified.   IMPRESSION: Mild increase in moderate sigmoid diverticulitis with localized perforation. No evidence of abscess or free air.     Electronically Signed   By: Danae Orleans M.D.   On: 03/05/2023 16:35   Assessment:   Diverticulitis.  Continues to improve clinically.  White count remains within normal limits.  Advance to regular diet and hopefully discharge in a.m.  labs/images/medications/previous chart entries reviewed personally and relevant changes/updates noted above.

## 2023-03-07 NOTE — Plan of Care (Signed)

## 2023-03-08 LAB — CREATININE, SERUM
Creatinine, Ser: 0.71 mg/dL (ref 0.61–1.24)
GFR, Estimated: >60 mL/min (ref >60)

## 2023-03-08 MED ORDER — CIPROFLOXACIN HCL 500 MG PO TABS
500.0000 mg | ORAL_TABLET | Freq: Two times a day (BID) | ORAL | 0 refills | Status: AC
Start: 2023-03-08 — End: 2023-03-15

## 2023-03-08 MED ORDER — METRONIDAZOLE 500 MG PO TABS
500.0000 mg | ORAL_TABLET | Freq: Two times a day (BID) | ORAL | 0 refills | Status: AC
Start: 2023-03-08 — End: 2023-03-15

## 2023-03-08 MED ORDER — IBUPROFEN 800 MG PO TABS: 800.0000 mg | ORAL_TABLET | Freq: Three times a day (TID) | ORAL | 0 refills | Status: AC | PRN

## 2023-03-08 MED ORDER — ACETAMINOPHEN 325 MG PO TABS: 650.0000 mg | ORAL_TABLET | Freq: Three times a day (TID) | ORAL | 0 refills | Status: AC | PRN

## 2023-03-08 MED ORDER — DOCUSATE SODIUM 100 MG PO CAPS: 100.0000 mg | ORAL_CAPSULE | Freq: Two times a day (BID) | ORAL | 0 refills | Status: AC | PRN

## 2023-03-08 MED ORDER — HYDROCODONE-ACETAMINOPHEN 5-325 MG PO TABS: 1.0000 | ORAL_TABLET | Freq: Four times a day (QID) | ORAL | 0 refills | Status: AC | PRN

## 2023-03-08 NOTE — Plan of Care (Signed)

## 2023-03-08 NOTE — Discharge Instructions (Signed)
No dietary or activity restrictions  Take medication as prescribed  Call office with additional questions or concerns

## 2023-03-08 NOTE — Progress Notes (Signed)
Notified MD of elevated blood pressure of 135/98, no new orders given.

## 2023-03-08 NOTE — Discharge Summary (Signed)
Physician Discharge Summary  Patient ID: Isaiah Taylor MRN: [id number] DOB/AGE: April 09, 1976 47 y.o.  Admit date: 03/01/2023 Discharge date: 03/08/2023  Admission Diagnoses: Diverticulitis  Discharge Diagnoses:  Same as above  Discharged Condition: good  Hospital Course: admitted for above.  Continued IV antibiotics.  Advance diet to regular but pain recurred with a slight increase in white blood cell count.  CT showed persistent sigmoid diverticulitis but no formed abscess amenable to drainage.  Zosyn switched over to Invanz and additional days of clear liquid diet restarted.  White count coming down and pain eventually improved to the point where he was able to tolerate regular diet.  Discussed proceeding with CT scan or outpatient monitoring.  Patient opted for outpatient monitoring.  Will be discharged with Cipro Flagyl and continue follow-up.  Consults: None  Discharge Exam: Blood pressure (!) 135/98, pulse 69, temperature 97.6 F (36.4 C), temperature source Oral, resp. rate 14, height 5\' 5"  (1.651 m), weight 102.1 kg, SpO2 98%. General appearance: alert, cooperative, and no distress GI: soft, non-tender; bowel sounds normal; no masses,  no organomegaly  Disposition:  Discharge disposition: 01-Home or Self Care       Discharge Instructions     Discharge patient   Complete by: As directed    Discharge disposition: 01-Home or Self Care   Discharge patient date: 03/08/2023      Allergies as of 03/08/2023   No Known Allergies      Medication List     TAKE these medications    acetaminophen 325 MG tablet Commonly known as: Tylenol Take 2 tablets (650 mg total) by mouth every 8 (eight) hours as needed for mild pain (pain score 1-3).   ciprofloxacin 500 MG tablet Commonly known as: Cipro Take 1 tablet (500 mg total) by mouth 2 (two) times daily for 7 days.   cyanocobalamin 1000 MCG tablet Commonly known as: VITAMIN B12 Take 1,000 mcg by mouth daily.    docusate sodium 100 MG capsule Commonly known as: Colace Take 1 capsule (100 mg total) by mouth 2 (two) times daily as needed for up to 10 days for mild constipation.   FIBER COMPLETE PO Take 1 capsule by mouth daily.   Fish Oil 1000 MG Caps Take 1,000 mg by mouth daily.   GINGER ROOT PO Take 1 capsule by mouth daily.   HYDROcodone-acetaminophen 5-325 MG tablet Commonly known as: Norco Take 1 tablet by mouth every 6 (six) hours as needed for up to 6 doses for moderate pain (pain score 4-6).   ibuprofen 800 MG tablet Commonly known as: ADVIL Take 1 tablet (800 mg total) by mouth every 8 (eight) hours as needed for mild pain (pain score 1-3) or moderate pain (pain score 4-6).   metroNIDAZOLE 500 MG tablet Commonly known as: Flagyl Take 1 tablet (500 mg total) by mouth 2 (two) times daily for 7 days.   multivitamin with minerals tablet Take 1 tablet by mouth daily.   vitamin C 1000 MG tablet Take 1,000 mg by mouth daily.   Vitamin D (Ergocalciferol) 1.25 MG (50000 UNIT) Caps capsule Commonly known as: DRISDOL Take 50,000 Units by mouth every 7 (seven) days.        Follow-up Information     Wingo, Aeva Posey, DO. Go on 03/14/2023.   Specialties: General Surgery, Surgery Why: diverticulitis;  Appt @ 1:15 pm (Bring Ins card; Co-Pay; Med List) Contact information: [address] [place] [address] [phone]  Total time spent arranging discharge was >28min. Signed: Sung Amabile 03/08/2023, 9:46 AM

## 2023-03-08 NOTE — Plan of Care (Signed)

## 2023-05-30 ENCOUNTER — Encounter: Payer: Self-pay | Admitting: Surgery

## 2023-05-30 ENCOUNTER — Ambulatory Visit: Payer: Managed Care, Other (non HMO) | Admitting: Certified Registered"

## 2023-05-30 ENCOUNTER — Other Ambulatory Visit: Payer: Self-pay

## 2023-05-30 ENCOUNTER — Ambulatory Visit
Admission: RE | Admit: 2023-05-30 | Discharge: 2023-05-30 | Disposition: A | Discharge status: Home / Self Care | Payer: Managed Care, Other (non HMO) | Attending: Surgery | Admitting: Surgery

## 2023-05-30 ENCOUNTER — Encounter
Admission: RE | Disposition: A | Discharge status: Home / Self Care | Payer: Self-pay | Source: Home / Self Care | Attending: Surgery

## 2023-05-30 DIAGNOSIS — K5792 Diverticulitis of intestine, part unspecified, without perforation or abscess without bleeding: Secondary | ICD-10-CM | POA: Diagnosis present

## 2023-05-30 DIAGNOSIS — Z09 Encounter for follow-up examination after completed treatment for conditions other than malignant neoplasm: Secondary | ICD-10-CM | POA: Insufficient documentation

## 2023-05-30 DIAGNOSIS — K5730 Diverticulosis of large intestine without perforation or abscess without bleeding: Secondary | ICD-10-CM | POA: Diagnosis not present

## 2023-05-30 DIAGNOSIS — K640 First degree hemorrhoids: Secondary | ICD-10-CM | POA: Insufficient documentation

## 2023-05-30 HISTORY — PX: COLONOSCOPY WITH PROPOFOL: SHX5780

## 2023-05-30 SURGERY — COLONOSCOPY WITH PROPOFOL
Anesthesia: General

## 2023-05-30 MED ORDER — SODIUM CHLORIDE 0.9 % IV SOLN: INTRAVENOUS | Status: DC

## 2023-05-30 MED ORDER — STERILE WATER FOR IRRIGATION IR SOLN
Status: DC | PRN
  Administered 2023-05-30: 60 mL

## 2023-05-30 MED ORDER — LIDOCAINE HCL (CARDIAC) PF 100 MG/5ML IV SOSY
PREFILLED_SYRINGE | INTRAVENOUS | Status: DC | PRN
  Administered 2023-05-30: 100 mg via INTRAVENOUS

## 2023-05-30 MED ORDER — LACTATED RINGERS IV SOLN: INTRAVENOUS | Status: DC | PRN

## 2023-05-30 MED ORDER — PROPOFOL 500 MG/50ML IV EMUL
INTRAVENOUS | Status: DC | PRN
  Administered 2023-05-30: 50 mg via INTRAVENOUS
  Administered 2023-05-30: 150 ug/kg/min via INTRAVENOUS

## 2023-05-30 NOTE — Anesthesia Preprocedure Evaluation (Signed)
 Anesthesia Evaluation  Patient identified by MRN, date of birth, ID band Patient awake    Reviewed: Allergy & Precautions, NPO status , Patient's Chart, lab work & pertinent test results  History of Anesthesia Complications Negative for: history of anesthetic complications  Airway Mallampati: III  TM Distance: <3 FB Neck ROM: full    Dental  (+) Chipped   Pulmonary neg pulmonary ROS, neg shortness of breath   Pulmonary exam normal        Cardiovascular Exercise Tolerance: Good (-) angina negative cardio ROS Normal cardiovascular exam     Neuro/Psych negative neurological ROS  negative psych ROS   GI/Hepatic negative GI ROS, Neg liver ROS,neg GERD  ,,  Endo/Other  negative endocrine ROS    Renal/GU negative Renal ROS  negative genitourinary   Musculoskeletal   Abdominal   Peds  Hematology negative hematology ROS (+)   Anesthesia Other Findings Past Medical History: No date: Pre-diabetes  Past Surgical History: 2014: BACK SURGERY  BMI    Body Mass Index: 35.83 kg/m      Reproductive/Obstetrics negative OB ROS                             Anesthesia Physical Anesthesia Plan  ASA: 2  Anesthesia Plan: General   Post-op Pain Management:    Induction: Intravenous  PONV Risk Score and Plan: Propofol  infusion and TIVA  Airway Management Planned: Natural Airway and Nasal Cannula  Additional Equipment:   Intra-op Plan:   Post-operative Plan:   Informed Consent: I have reviewed the patients History and Physical, chart, labs and discussed the procedure including the risks, benefits and alternatives for the proposed anesthesia with the patient or authorized representative who has indicated his/her understanding and acceptance.     Dental Advisory Given  Plan Discussed with: Anesthesiologist, CRNA and Surgeon  Anesthesia Plan Comments: (Patient consented for risks of  anesthesia including but not limited to:  - adverse reactions to medications - risk of airway placement if required - damage to eyes, teeth, lips or other oral mucosa - nerve damage due to positioning  - sore throat or hoarseness - Damage to heart, brain, nerves, lungs, other parts of body or loss of life  Patient voiced understanding and assent.)       Anesthesia Quick Evaluation

## 2023-05-30 NOTE — Transfer of Care (Signed)
 Immediate Anesthesia Transfer of Care Note  Patient: Isaiah Taylor  Procedure(s) Performed: COLONOSCOPY WITH PROPOFOL   Patient Location: Endo  Anesthesia Type:General  Level of Consciousness: drowsy and patient cooperative  Airway & Oxygen Therapy: Patient Spontanous Breathing and Patient connected to nasal cannula oxygen  Post-op Assessment: Report given to RN and Post -op Vital signs reviewed and stable  Post vital signs: stable  Last Vitals:  Vitals Value Taken Time  BP    Temp    Pulse    Resp    SpO2      Last Pain:  Vitals:   05/30/23 0817  TempSrc: Temporal  PainSc: 0-No pain         Complications: No notable events documented.

## 2023-05-30 NOTE — Op Note (Addendum)
 [facility] Gastroenterology Patient Name: Isaiah Taylor Procedure Date: 05/30/2023 8:03 AM MRN: [id number] Account #: [id number] Date of Birth: Jul 12, 1975 Admit Type: Outpatient Age: 48 Room: [facility] ENDO ROOM 2 Gender: Male Note Status: Finalized Instrument Name: Colonscope [id number] Procedure:             Colonoscopy Indications:           Follow-up of diverticulitis Providers:             Henriette Pierre MD, MD Referring MD:          Tamra Leventhal, MD (Referring MD) Medicines:             Propofol  per Anesthesia Complications:         No immediate complications. Procedure:             Pre-Anesthesia Assessment:                        - After reviewing the risks and benefits, the patient                         was deemed in satisfactory condition to undergo the                         procedure in an ambulatory setting.                        After obtaining informed consent, the colonoscope was                         passed under direct vision. Throughout the procedure,                         the patient's blood pressure, pulse, and oxygen                         saturations were monitored continuously. The                         Colonoscope was introduced through the anus and                         advanced to the the cecum, identified by the ileocecal                         valve. The colonoscopy was performed without                         difficulty. The patient tolerated the procedure well.                         The quality of the bowel preparation was good. Findings:      The perianal and digital rectal examinations were normal.      A few small-mouthed diverticula were found in the sigmoid colon.      Non-bleeding internal hemorrhoids were found during retroflexion. The       hemorrhoids were Grade I (internal hemorrhoids that do not prolapse). Impression:            - Diverticulosis in the sigmoid colon.                        -  Non-bleeding  internal hemorrhoids.                        - No specimens collected. Recommendation:        - Written discharge instructions were provided to the                         patient.                        - Discharge patient to home.                        - Resume previous diet.                        - Repeat colonoscopy in 10 years for screening                         purposes. Procedure Code(s):     --- Professional ---                        [id number], Colonoscopy, flexible; diagnostic, including                         collection of specimen(s) by brushing or washing, when                         performed (separate procedure) Diagnosis Code(s):     --- Professional ---                        K57.30, Diverticulosis of large intestine without                         perforation or abscess without bleeding                        K57.32, Diverticulitis of large intestine without                         perforation or abscess without bleeding                        K64.0, First degree hemorrhoids CPT copyright 2022 [employer]. All rights reserved. The codes documented in this report are preliminary and upon coder review may  be revised to meet current compliance requirements. Dr. Henriette Sevin, MD Henriette Pierre MD, MD 05/30/2023 8:58:23 AM This report has been signed electronically. Number of Addenda: 0 Note Initiated On: 05/30/2023 8:03 AM Scope Withdrawal Time: 0 hours 7 minutes 50 seconds  Total Procedure Duration: 0 hours 11 minutes 5 seconds       [facility]

## 2023-05-30 NOTE — Interval H&P Note (Signed)
 History and Physical Interval Note:  05/30/2023 8:11 AM  Isaiah Taylor  has presented today for surgery, with the diagnosis of diverticulitis K57.92.  The various methods of treatment have been discussed with the patient and family. After consideration of risks, benefits and other options for treatment, the patient has consented to  Procedure(s): COLONOSCOPY WITH PROPOFOL  (N/A) as a surgical intervention.  The patient's history has been reviewed, patient examined, no change in status, stable for surgery.  I have reviewed the patient's chart and labs.  Questions were answered to the patient's satisfaction.     Crista Nuon Tye

## 2023-05-30 NOTE — Anesthesia Postprocedure Evaluation (Signed)
 Anesthesia Post Note  Patient: Isaiah Taylor  Procedure(s) Performed: COLONOSCOPY WITH PROPOFOL   Patient location during evaluation: Endoscopy Anesthesia Type: General Level of consciousness: awake and alert Pain management: pain level controlled Vital Signs Assessment: post-procedure vital signs reviewed and stable Respiratory status: spontaneous breathing, nonlabored ventilation, respiratory function stable and patient connected to nasal cannula oxygen Cardiovascular status: blood pressure returned to baseline and stable Postop Assessment: no apparent nausea or vomiting Anesthetic complications: no   There were no known notable events for this encounter.   Last Vitals:  Vitals:   05/30/23 0906 05/30/23 0915  BP: 113/83 113/80  Pulse:    Resp:    Temp:    SpO2:      Last Pain:  Vitals:   05/30/23 0915  TempSrc:   PainSc: 0-No pain                 Fairy POUR Torrell Krutz

## 2023-05-30 NOTE — H&P (Signed)
 Subjective:  CC: Diverticulitis [K57.92]  HPI: Isaiah Taylor is a 48 y.o. male who is here for followup from above. Still has occasional pain, especially with red meat. Pain is more tolerable. No fever or issues with eating.  Current Medications: has a current medication list which includes the following prescription(s): tadalafil.  Allergies: No Known Allergies  ROS: General: Denies weight loss, weight gain, fatigue, fevers, chills, and night sweats. Heart: Denies chest pain, palpitations, racing heart, irregular heartbeat, leg pain or swelling, and decreased activity tolerance. Respiratory: Denies breathing difficulty, shortness of breath, wheezing, cough, and sputum. GI: Denies change in appetite, heartburn, nausea, vomiting, constipation, diarrhea, and blood in stool. GU: Denies difficulty urinating, pain with urinating, urgency, frequency, blood in urine   Objective:   BP 122/80  Pulse 77  Ht 167.6 cm (5' 5.98)  Wt (!) 104.3 kg (229 lb 15 oz)  BMI 37.13 kg/m  Constitutional : Alert, no distress, cooperative Gastrointestinal: soft, non-tender; bowel sounds normal; no masses, no organomegaly. Musculoskeletal: Steady gait and movement Skin: Cool and moist, minor TTP LLQ still present. Psychiatric: Normal affect, non-agitated, not confused   LABS: N/A  RADS: N/A  Assessment:   Diverticulitis [K57.92] S/p IV abx  Plan:   Degree of pain reported and on clinical exam reassuring that no worsening and likely no developing abscess. Recommended colonoscopy in a few weeks to complete workup.   R/b/a discussed.  Risks include bleeding, perforation.  Benefits include diagnostic, curative procedure if needed.  Alternatives include continued observation.  Pt verbalized understanding.  labs/images/medications/previous chart entries reviewed personally and relevant changes/updates noted above. Electronically signed by Tye Millet, DO at 03/14/2023 1:58 PM EST

## 2023-05-31 ENCOUNTER — Encounter: Payer: Self-pay | Admitting: Surgery

## 2023-06-04 NOTE — H&P (Signed)
 Subjective:  CC: Diverticulitis [K57.92]  HPI: Isaiah Taylor is a 48 y.o. male who is here for followup from above. Still has occasional pain, especially with red meat. Pain is more tolerable. No fever or issues with eating.  Current Medications: has a current medication list which includes the following prescription(s): tadalafil.  Allergies: No Known Allergies  ROS: General: Denies weight loss, weight gain, fatigue, fevers, chills, and night sweats. Heart: Denies chest pain, palpitations, racing heart, irregular heartbeat, leg pain or swelling, and decreased activity tolerance. Respiratory: Denies breathing difficulty, shortness of breath, wheezing, cough, and sputum. GI: Denies change in appetite, heartburn, nausea, vomiting, constipation, diarrhea, and blood in stool. GU: Denies difficulty urinating, pain with urinating, urgency, frequency, blood in urine   Objective:   BP 122/80  Pulse 77  Ht 167.6 cm (5' 5.98)  Wt (!) 104.3 kg (229 lb 15 oz)  BMI 37.13 kg/m  Constitutional : Alert, no distress, cooperative Gastrointestinal: soft, non-tender; bowel sounds normal; no masses, no organomegaly. Musculoskeletal: Steady gait and movement Skin: Cool and moist, minor TTP LLQ still present. Psychiatric: Normal affect, non-agitated, not confused   LABS: N/A  RADS: N/A  Assessment:   Diverticulitis [K57.92] S/p IV abx  Plan:   Degree of pain reported and on clinical exam reassuring that no worsening and likely no developing abscess. Recommended colonoscopy in a few weeks to complete workup.   R/b/a discussed.  Risks include bleeding, perforation.  Benefits include diagnostic, curative procedure if needed.  Alternatives include continued observation.  Pt verbalized understanding.  labs/images/medications/previous chart entries reviewed personally and relevant changes/updates noted above.
